# Patient Record
Sex: Female | Born: 1990 | Race: Black or African American | Hispanic: No | Marital: Single | Smoking: Current every day smoker
Health system: Southern US, Community
[De-identification: ages and names within clinical notes are randomized; demographics above are authoritative.]

## PROBLEM LIST (undated history)

## (undated) DIAGNOSIS — A599 Trichomoniasis, unspecified: Secondary | ICD-10-CM

## (undated) DIAGNOSIS — F329 Major depressive disorder, single episode, unspecified: Secondary | ICD-10-CM

## (undated) DIAGNOSIS — T1491XA Suicide attempt, initial encounter: Secondary | ICD-10-CM

## (undated) DIAGNOSIS — N12 Tubulo-interstitial nephritis, not specified as acute or chronic: Secondary | ICD-10-CM

## (undated) DIAGNOSIS — F32A Depression, unspecified: Secondary | ICD-10-CM

## (undated) DIAGNOSIS — A539 Syphilis, unspecified: Secondary | ICD-10-CM

## (undated) DIAGNOSIS — N390 Urinary tract infection, site not specified: Secondary | ICD-10-CM

## (undated) DIAGNOSIS — R87619 Unspecified abnormal cytological findings in specimens from cervix uteri: Secondary | ICD-10-CM

## (undated) DIAGNOSIS — IMO0002 Reserved for concepts with insufficient information to code with codable children: Secondary | ICD-10-CM

## (undated) DIAGNOSIS — A549 Gonococcal infection, unspecified: Secondary | ICD-10-CM

## (undated) DIAGNOSIS — D649 Anemia, unspecified: Secondary | ICD-10-CM

---
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
## 2008-07-28 ENCOUNTER — Emergency Department (HOSPITAL_COMMUNITY): Admission: EM | Admit: 2008-07-28 | Discharge: 2008-07-28 | Payer: Self-pay | Admitting: Emergency Medicine

## 2009-05-08 ENCOUNTER — Inpatient Hospital Stay (HOSPITAL_COMMUNITY): Admission: AD | Admit: 2009-05-08 | Discharge: 2009-05-08 | Payer: Self-pay | Admitting: Obstetrics & Gynecology

## 2009-06-01 ENCOUNTER — Ambulatory Visit: Payer: Self-pay | Admitting: Family Medicine

## 2009-06-29 ENCOUNTER — Ambulatory Visit: Payer: Self-pay | Admitting: Obstetrics & Gynecology

## 2009-07-14 ENCOUNTER — Inpatient Hospital Stay (HOSPITAL_COMMUNITY): Admission: AD | Admit: 2009-07-14 | Discharge: 2009-07-14 | Payer: Self-pay | Admitting: Family Medicine

## 2009-07-14 ENCOUNTER — Ambulatory Visit: Payer: Self-pay | Admitting: Obstetrics and Gynecology

## 2009-07-27 ENCOUNTER — Ambulatory Visit: Payer: Self-pay | Admitting: Family Medicine

## 2009-07-28 ENCOUNTER — Ambulatory Visit (HOSPITAL_COMMUNITY): Admission: RE | Admit: 2009-07-28 | Discharge: 2009-07-28 | Payer: Self-pay | Admitting: Family Medicine

## 2009-08-10 ENCOUNTER — Ambulatory Visit: Payer: Self-pay | Admitting: Family Medicine

## 2009-08-24 ENCOUNTER — Ambulatory Visit: Payer: Self-pay | Admitting: Obstetrics & Gynecology

## 2009-09-14 ENCOUNTER — Encounter (INDEPENDENT_AMBULATORY_CARE_PROVIDER_SITE_OTHER): Payer: Self-pay | Admitting: *Deleted

## 2009-09-14 ENCOUNTER — Ambulatory Visit: Payer: Self-pay | Admitting: Family Medicine

## 2009-09-28 ENCOUNTER — Ambulatory Visit: Payer: Self-pay | Admitting: Obstetrics and Gynecology

## 2009-09-28 ENCOUNTER — Ambulatory Visit (HOSPITAL_COMMUNITY): Admission: RE | Admit: 2009-09-28 | Discharge: 2009-09-28 | Payer: Self-pay | Admitting: Family Medicine

## 2009-09-28 ENCOUNTER — Encounter: Payer: Self-pay | Admitting: Family Medicine

## 2009-09-28 LAB — CONVERTED CEMR LAB
HCT: 34.8 % — ABNORMAL LOW (ref 36.0–46.0)
Platelets: 234 10*3/uL (ref 150–400)
RBC: 3.9 M/uL (ref 3.87–5.11)

## 2009-10-12 ENCOUNTER — Ambulatory Visit: Payer: Self-pay | Admitting: Obstetrics & Gynecology

## 2009-10-26 ENCOUNTER — Encounter: Payer: Self-pay | Admitting: Family Medicine

## 2009-10-26 ENCOUNTER — Ambulatory Visit (HOSPITAL_COMMUNITY): Admission: RE | Admit: 2009-10-26 | Discharge: 2009-10-26 | Payer: Self-pay | Admitting: Obstetrics & Gynecology

## 2009-10-26 ENCOUNTER — Ambulatory Visit: Payer: Self-pay | Admitting: Obstetrics and Gynecology

## 2009-10-27 ENCOUNTER — Ambulatory Visit: Payer: Self-pay | Admitting: Obstetrics & Gynecology

## 2009-10-28 ENCOUNTER — Encounter: Payer: Self-pay | Admitting: Obstetrics & Gynecology

## 2009-10-28 LAB — CONVERTED CEMR LAB
Trich, Wet Prep: NONE SEEN
Yeast Wet Prep HPF POC: NONE SEEN

## 2009-11-02 ENCOUNTER — Ambulatory Visit: Payer: Self-pay | Admitting: Obstetrics and Gynecology

## 2009-11-09 ENCOUNTER — Ambulatory Visit: Payer: Self-pay | Admitting: Obstetrics & Gynecology

## 2009-11-16 ENCOUNTER — Ambulatory Visit: Payer: Self-pay | Admitting: Family Medicine

## 2009-11-23 ENCOUNTER — Ambulatory Visit: Payer: Self-pay | Admitting: Obstetrics & Gynecology

## 2009-11-23 LAB — CONVERTED CEMR LAB: GC Probe Amp, Genital: NEGATIVE

## 2009-11-24 ENCOUNTER — Encounter: Payer: Self-pay | Admitting: Obstetrics & Gynecology

## 2009-11-26 ENCOUNTER — Inpatient Hospital Stay (HOSPITAL_COMMUNITY): Admission: AD | Admit: 2009-11-26 | Discharge: 2009-11-27 | Payer: Self-pay | Admitting: Obstetrics and Gynecology

## 2009-11-26 ENCOUNTER — Ambulatory Visit: Payer: Self-pay | Admitting: Obstetrics and Gynecology

## 2009-11-27 ENCOUNTER — Ambulatory Visit: Payer: Self-pay | Admitting: Obstetrics and Gynecology

## 2009-11-27 ENCOUNTER — Inpatient Hospital Stay (HOSPITAL_COMMUNITY): Admission: AD | Admit: 2009-11-27 | Discharge: 2009-11-27 | Payer: Self-pay | Admitting: Family Medicine

## 2009-12-07 ENCOUNTER — Ambulatory Visit: Payer: Self-pay | Admitting: Obstetrics & Gynecology

## 2009-12-12 ENCOUNTER — Ambulatory Visit: Payer: Self-pay | Admitting: Physician Assistant

## 2009-12-12 ENCOUNTER — Inpatient Hospital Stay (HOSPITAL_COMMUNITY): Admission: AD | Admit: 2009-12-12 | Discharge: 2009-12-15 | Payer: Self-pay | Admitting: Obstetrics & Gynecology

## 2010-01-15 ENCOUNTER — Ambulatory Visit: Payer: Self-pay | Admitting: Obstetrics and Gynecology

## 2010-03-04 NOTE — L&D Delivery Note (Signed)
Delivery Note At 10:32 PM a healthy female was delivered via Vaginal, Spontaneous Delivery (Presentation: ; Occiput Anterior).  APGAR: 8, 9; weight 6 lb 1 oz (2750 g).   Placenta status: Intact, Spontaneous.  Cord: 3 vessels with the following complications: None.   Anesthesia: Epidural  Episiotomy: None Lacerations: None Suture Repair: n/a Est. Blood Loss (mL): <500 mL  Mom to postpartum.  Baby to nursery-stable.  Case supervised by Maylon Cos, CNM  Normalee Sistare 02/27/2011, 10:49 PM

## 2010-03-12 ENCOUNTER — Ambulatory Visit: Admit: 2010-03-12 | Payer: Self-pay | Admitting: Obstetrics and Gynecology

## 2010-03-25 ENCOUNTER — Encounter: Payer: Self-pay | Admitting: *Deleted

## 2010-03-25 ENCOUNTER — Encounter: Payer: Self-pay | Admitting: Family Medicine

## 2010-04-02 ENCOUNTER — Ambulatory Visit
Admission: RE | Admit: 2010-04-02 | Payer: Self-pay | Source: Home / Self Care | Attending: Obstetrics and Gynecology | Admitting: Obstetrics and Gynecology

## 2010-04-04 ENCOUNTER — Other Ambulatory Visit: Payer: Self-pay | Admitting: Family Medicine

## 2010-04-04 ENCOUNTER — Ambulatory Visit: Payer: Medicaid Other

## 2010-04-04 ENCOUNTER — Ambulatory Visit: Payer: Medicaid Other | Admitting: Family Medicine

## 2010-04-04 ENCOUNTER — Other Ambulatory Visit (HOSPITAL_COMMUNITY)
Admission: RE | Admit: 2010-04-04 | Discharge: 2010-04-04 | Disposition: A | Discharge status: Home / Self Care | Payer: Medicaid Other | Source: Ambulatory Visit | Attending: Obstetrics and Gynecology | Admitting: Obstetrics and Gynecology

## 2010-04-04 ENCOUNTER — Other Ambulatory Visit: Payer: Self-pay | Admitting: Obstetrics and Gynecology

## 2010-04-04 DIAGNOSIS — Z124 Encounter for screening for malignant neoplasm of cervix: Secondary | ICD-10-CM

## 2010-04-04 DIAGNOSIS — Z01419 Encounter for gynecological examination (general) (routine) without abnormal findings: Secondary | ICD-10-CM

## 2010-04-04 DIAGNOSIS — Z3049 Encounter for surveillance of other contraceptives: Secondary | ICD-10-CM

## 2010-04-13 ENCOUNTER — Emergency Department (HOSPITAL_COMMUNITY)
Admission: EM | Admit: 2010-04-13 | Discharge: 2010-04-13 | Disposition: A | Discharge status: Home / Self Care | Payer: Medicaid Other | Attending: Emergency Medicine | Admitting: Emergency Medicine

## 2010-04-13 DIAGNOSIS — N760 Acute vaginitis: Secondary | ICD-10-CM | POA: Insufficient documentation

## 2010-04-13 DIAGNOSIS — R112 Nausea with vomiting, unspecified: Secondary | ICD-10-CM | POA: Insufficient documentation

## 2010-04-13 DIAGNOSIS — R197 Diarrhea, unspecified: Secondary | ICD-10-CM | POA: Insufficient documentation

## 2010-04-13 DIAGNOSIS — R109 Unspecified abdominal pain: Secondary | ICD-10-CM | POA: Insufficient documentation

## 2010-04-13 DIAGNOSIS — B9689 Other specified bacterial agents as the cause of diseases classified elsewhere: Secondary | ICD-10-CM | POA: Insufficient documentation

## 2010-04-13 DIAGNOSIS — A499 Bacterial infection, unspecified: Secondary | ICD-10-CM | POA: Insufficient documentation

## 2010-04-13 LAB — CBC
HCT: 42.8 % (ref 36.0–46.0)
MCH: 27.8 pg (ref 26.0–34.0)
MCHC: 32.2 g/dL (ref 30.0–36.0)
MCV: 86.1 fL (ref 78.0–100.0)
Platelets: 256 10*3/uL (ref 150–400)
RDW: 12.5 % (ref 11.5–15.5)
WBC: 6.9 10*3/uL (ref 4.0–10.5)

## 2010-04-13 LAB — POCT I-STAT, CHEM 8
BUN: 6 mg/dL (ref 6–23)
Calcium, Ion: 1.11 mmol/L — ABNORMAL LOW (ref 1.12–1.32)
Chloride: 107 mEq/L (ref 96–112)
Creatinine, Ser: 0.9 mg/dL (ref 0.4–1.2)
Glucose, Bld: 98 mg/dL (ref 70–99)
HCT: 45 % (ref 36.0–46.0)
Hemoglobin: 15.3 g/dL — ABNORMAL HIGH (ref 12.0–15.0)
Potassium: 4.2 mEq/L (ref 3.5–5.1)
Sodium: 139 mEq/L (ref 135–145)
TCO2: 20 mmol/L (ref 0–100)

## 2010-04-13 LAB — URINE MICROSCOPIC-ADD ON

## 2010-04-13 LAB — DIFFERENTIAL
Eosinophils Absolute: 0.1 10*3/uL (ref 0.0–0.7)
Eosinophils Relative: 1 % (ref 0–5)
Lymphocytes Relative: 13 % (ref 12–46)
Lymphs Abs: 0.9 10*3/uL (ref 0.7–4.0)
Monocytes Absolute: 0.4 10*3/uL (ref 0.1–1.0)
Monocytes Relative: 6 % (ref 3–12)

## 2010-04-13 LAB — WET PREP, GENITAL: Trich, Wet Prep: NONE SEEN

## 2010-04-13 LAB — URINALYSIS, ROUTINE W REFLEX MICROSCOPIC
Hgb urine dipstick: NEGATIVE
Protein, ur: NEGATIVE mg/dL
Specific Gravity, Urine: 1.027 (ref 1.005–1.030)
Urine Glucose, Fasting: NEGATIVE mg/dL

## 2010-04-15 LAB — GC/CHLAMYDIA PROBE AMP, GENITAL
Chlamydia, DNA Probe: NEGATIVE
GC Probe Amp, Genital: NEGATIVE

## 2010-04-18 ENCOUNTER — Ambulatory Visit (INDEPENDENT_AMBULATORY_CARE_PROVIDER_SITE_OTHER): Payer: Medicaid Other | Admitting: Family Medicine

## 2010-04-18 ENCOUNTER — Other Ambulatory Visit: Payer: Self-pay | Admitting: Family Medicine

## 2010-04-18 DIAGNOSIS — Z3202 Encounter for pregnancy test, result negative: Secondary | ICD-10-CM

## 2010-04-18 DIAGNOSIS — Z3009 Encounter for other general counseling and advice on contraception: Secondary | ICD-10-CM

## 2010-04-18 LAB — POCT PREGNANCY, URINE: Preg Test, Ur: NEGATIVE

## 2010-05-17 LAB — URINALYSIS, ROUTINE W REFLEX MICROSCOPIC
Bilirubin Urine: NEGATIVE
Ketones, ur: NEGATIVE mg/dL
Nitrite: NEGATIVE
Protein, ur: NEGATIVE mg/dL
Urobilinogen, UA: 0.2 mg/dL (ref 0.0–1.0)

## 2010-05-17 LAB — POCT URINALYSIS DIPSTICK
Bilirubin Urine: NEGATIVE
Bilirubin Urine: NEGATIVE
Bilirubin Urine: NEGATIVE
Glucose, UA: NEGATIVE mg/dL
Glucose, UA: NEGATIVE mg/dL
Glucose, UA: NEGATIVE mg/dL
Hgb urine dipstick: NEGATIVE
Ketones, ur: NEGATIVE mg/dL
Ketones, ur: NEGATIVE mg/dL
Nitrite: NEGATIVE
Nitrite: NEGATIVE
Nitrite: NEGATIVE
Protein, ur: NEGATIVE mg/dL
Protein, ur: NEGATIVE mg/dL
Specific Gravity, Urine: 1.015 (ref 1.005–1.030)
Urobilinogen, UA: 0.2 mg/dL (ref 0.0–1.0)
pH: 6 (ref 5.0–8.0)

## 2010-05-17 LAB — CBC
HCT: 35.1 % — ABNORMAL LOW (ref 36.0–46.0)
Hemoglobin: 11.8 g/dL — ABNORMAL LOW (ref 12.0–15.0)
MCH: 29.8 pg (ref 26.0–34.0)
MCV: 89.1 fL (ref 78.0–100.0)
MCV: 89.7 fL (ref 78.0–100.0)
Platelets: 193 10*3/uL (ref 150–400)
RBC: 3.94 MIL/uL (ref 3.87–5.11)
RDW: 12.4 % (ref 11.5–15.5)
WBC: 11.1 10*3/uL — ABNORMAL HIGH (ref 4.0–10.5)

## 2010-05-17 LAB — URINE CULTURE
Colony Count: NO GROWTH
Culture  Setup Time: 201110120115
Culture: NO GROWTH

## 2010-05-17 LAB — RPR: RPR Ser Ql: NONREACTIVE

## 2010-05-18 LAB — POCT URINALYSIS DIPSTICK
Bilirubin Urine: NEGATIVE
Glucose, UA: NEGATIVE mg/dL
Glucose, UA: NEGATIVE mg/dL
Hgb urine dipstick: NEGATIVE
Ketones, ur: NEGATIVE mg/dL
Nitrite: NEGATIVE
Specific Gravity, Urine: 1.02 (ref 1.005–1.030)
Specific Gravity, Urine: 1.02 (ref 1.005–1.030)

## 2010-05-19 LAB — POCT URINALYSIS DIP (DEVICE)
Bilirubin Urine: NEGATIVE
Hgb urine dipstick: NEGATIVE
Ketones, ur: NEGATIVE mg/dL
pH: 6.5 (ref 5.0–8.0)

## 2010-05-20 LAB — POCT URINALYSIS DIP (DEVICE)
Glucose, UA: NEGATIVE mg/dL
Hgb urine dipstick: NEGATIVE
Ketones, ur: NEGATIVE mg/dL
Ketones, ur: NEGATIVE mg/dL
Protein, ur: NEGATIVE mg/dL
Specific Gravity, Urine: 1.02 (ref 1.005–1.030)
Specific Gravity, Urine: 1.02 (ref 1.005–1.030)
Urobilinogen, UA: 0.2 mg/dL (ref 0.0–1.0)

## 2010-05-21 LAB — POCT URINALYSIS DIP (DEVICE)
Bilirubin Urine: NEGATIVE
Bilirubin Urine: NEGATIVE
Glucose, UA: NEGATIVE mg/dL
Hgb urine dipstick: NEGATIVE
Ketones, ur: NEGATIVE mg/dL
Nitrite: NEGATIVE
Protein, ur: NEGATIVE mg/dL
Specific Gravity, Urine: 1.02 (ref 1.005–1.030)
Urobilinogen, UA: 0.2 mg/dL (ref 0.0–1.0)
Urobilinogen, UA: 0.2 mg/dL (ref 0.0–1.0)
pH: 7.5 (ref 5.0–8.0)

## 2010-05-22 LAB — URINALYSIS, ROUTINE W REFLEX MICROSCOPIC
Nitrite: NEGATIVE
Protein, ur: NEGATIVE mg/dL
Urobilinogen, UA: 0.2 mg/dL (ref 0.0–1.0)

## 2010-05-22 LAB — POCT URINALYSIS DIP (DEVICE)
Glucose, UA: NEGATIVE mg/dL
Hgb urine dipstick: NEGATIVE
Nitrite: NEGATIVE
Urobilinogen, UA: 1 mg/dL (ref 0.0–1.0)

## 2010-05-25 NOTE — Progress Notes (Signed)
Pamela Bridges, Pamela Bridges NO.:  [id number]  MEDICAL RECORD NO.:  [id number]           PATIENT TYPE:  A  LOCATION:  [facility]                   FACILITY:  [facility]  PHYSICIAN:  Lucina Mellow, DO   DATE OF BIRTH:  03-31-1990  DATE OF SERVICE:  04/18/2010                                 CLINIC NOTE  The patient's chief complaint for visit was that she needed to receive her Depo shot, however.  HISTORY OF PRESENT ILLNESS:  She had unprotected sex last night and she wanted to discuss other options for her birth control.  HISTORY OF PRESENT ILLNESS:  Includes that she presented 2 weeks ago for scheduled Depo shot, but had had unprotected sex in the interim and she was late last time for her Depo shot, was told to abstain from sex or use a barrier method for the last 2 weeks and then she comes to the clinic today and gets the shot, however, because she had unprotected sex last night.  She is having to wait for her Depo shot.  She did get a urine pregnancy tested in clinic which was negative and she opts to have oral birth control pills.  She has no other complaints.  She is 4 months postpartum.  She is a gravida 2, para 2.  PHYSICAL EXAMINATION:  VITAL SIGNS:  Her temperature is 98.1, pulse is 82, blood pressure 104/63, weight of 160.8, height is 65 inches. GENERAL:  The patient is a pleasant African American female who looks her stated age of 20 years old. HEART:  Regular rate and rhythm with no murmurs. LUNGS:  Clear to auscultation bilaterally.  ASSESSMENT:  Undesired fertility.  PLAN:  She has a negative urine pregnancy test today in clinic and desires to oral contraceptive pills.  We will write a prescription today for Sprintec.  She is recommended to start the Sprintec today starting mid week of the first week and then go forward with the pill back at that time.  She also states that she had a period.  Her last day of bleeding was April 04, 2010, and since  that was approximately 2 weeks ago, she may be about to start a period again and she can also wait and see when her period starts to start the birth control at that time, however in the interim, she does need to use a barrier method whether it is a condom or spermicidal inserter gel.  She voices understanding of these plans and agrees to try the Sprintec if she has any problems, placed a home pregnancy test, if it turns out positive or when this is due, she is to call the clinic.  She voices understanding and agrees with these plans.          ______________________________ Lucina Mellow, DO    SH/MEDQ  D:  04/18/2010  T:  04/19/2010  Job:  [id number]

## 2010-05-27 LAB — URINE MICROSCOPIC-ADD ON

## 2010-05-27 LAB — WET PREP, GENITAL: Yeast Wet Prep HPF POC: NONE SEEN

## 2010-05-27 LAB — GC/CHLAMYDIA PROBE AMP, GENITAL: Chlamydia, DNA Probe: NEGATIVE

## 2010-05-27 LAB — URINALYSIS, ROUTINE W REFLEX MICROSCOPIC
Hgb urine dipstick: NEGATIVE
Protein, ur: NEGATIVE mg/dL
Urobilinogen, UA: 2 mg/dL — ABNORMAL HIGH (ref 0.0–1.0)

## 2010-05-27 LAB — POCT URINALYSIS DIP (DEVICE)
Bilirubin Urine: NEGATIVE
Ketones, ur: NEGATIVE mg/dL

## 2010-05-27 LAB — URINE CULTURE: Colony Count: NO GROWTH

## 2010-05-27 LAB — POCT PREGNANCY, URINE: Preg Test, Ur: POSITIVE

## 2010-06-12 LAB — URINALYSIS, ROUTINE W REFLEX MICROSCOPIC
Nitrite: NEGATIVE
Specific Gravity, Urine: 1.027 (ref 1.005–1.030)
pH: 6 (ref 5.0–8.0)

## 2010-06-12 LAB — URINE CULTURE: Colony Count: 8000

## 2010-06-12 LAB — POCT PREGNANCY, URINE: Preg Test, Ur: NEGATIVE

## 2010-06-12 LAB — URINE MICROSCOPIC-ADD ON

## 2010-07-20 NOTE — Discharge Summary (Signed)
Pamela Bridges, Pamela Bridges        ACCOUNT NO.:  [id number]   MEDICAL RECORD NO.:  [id number]          PATIENT TYPE:  INP   LOCATION:  9303                          [facility]ILITY[facility]   PHYSICIAN:  Tracy L. Mayford Knife, M.D.DATE OF BIRTH:  November 08, 1990   DATE OF ADMISSION:  04/24/2006  DATE OF DISCHARGE:  04/25/2006                               DISCHARGE SUMMARY   REASON FOR ADMISSION:  1. A 17-week pregnancy with left CVA tenderness, rule out      pyelonephritis.  2. 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  3. Polysubstance abuse.   DISCHARGE DIAGNOSES:  1. A 17-week 2-day intrauterine pregnancy and delivered with left CVA      tenderness consistent with musculoskeletal pain.  No signs of      pyelonephritis.  2. Positive RPR.  3. Polysubstance abuse.   PERTINENT LABORATORY DATA:  UA with specific gravity of greater than  1.03, negative leukocyte esterase for nitrates and protein.  CBC with  hemoglobin 11, wbc 6.8.   HOSPITAL COURSE:  Patient is a 20 year old G1 at 17 weeks 1 day, here as  a direct admit from [facility] She presented for her routine  prenatal visit and complained of left CVA tenderness.  Reported it had  started that day, rated it 7/10, denied dysuria, fever, chills or night  sweats.  Urine at [facility] showed numerous wbc.  Patient was going  to be treated as an outpatient but when Dr. Penne Lash tried to counsel her  on signs and symptoms that she should be reevaluated in the MAU, patient  was unable to comprehend so decision was made to admit for observation.   Patient's hospital course was unremarkable.  She was started on Rocephin  and IV fluids which she tolerated well.  Continued to have some CVA  tenderness, though was encouraged to use a heating pad.   Patient was also given her flu vaccine during this hospitalization and  at the time of this dictation she has yet to get it.  She does endorse  an allergy to eggs  that she described as causing her to itch and vomit;  however, she denies anaphylaxis.  Patient also reports that she has had  the flu vaccine previously when she was incarcerated and had no  difficulties with it.  Patient was counseled on the risks and benefits  and wanted to get the vaccine.   Patient was found to have a positive RPR with titer 1-2 during her  prenatal visit.  She was given Bicillin in July 2007 but never returned  for her second and third treatment so she was restarted on Bicillin on  April 24, 2006, and the rest of the doses will be given at the health  department.   DISPOSITION:  Home.   FOLLOW UP:  Women's Health in one week and Bicillin per health  department.  Routine preterm labor and counseled on signs and symptoms  ob pyelonephritis.   DIET:  Regular.           ______________________________  Marc Morgans. Mayford Knife, M.D.  TLW/MEDQ  D:  04/25/2006  T:  04/25/2006  Job:  [id number]

## 2010-08-16 ENCOUNTER — Inpatient Hospital Stay (HOSPITAL_COMMUNITY): Payer: Medicaid Other

## 2010-08-16 ENCOUNTER — Inpatient Hospital Stay (HOSPITAL_COMMUNITY)
Admission: AD | Admit: 2010-08-16 | Discharge: 2010-08-16 | Disposition: A | Discharge status: Home / Self Care | Payer: Medicaid Other | Source: Ambulatory Visit | Attending: Family Medicine | Admitting: Family Medicine

## 2010-08-16 DIAGNOSIS — O9989 Other specified diseases and conditions complicating pregnancy, childbirth and the puerperium: Secondary | ICD-10-CM

## 2010-08-16 DIAGNOSIS — R109 Unspecified abdominal pain: Secondary | ICD-10-CM | POA: Insufficient documentation

## 2010-08-16 DIAGNOSIS — O99891 Other specified diseases and conditions complicating pregnancy: Secondary | ICD-10-CM | POA: Insufficient documentation

## 2010-08-16 LAB — WET PREP, GENITAL
Clue Cells Wet Prep HPF POC: NONE SEEN
Trich, Wet Prep: NONE SEEN

## 2010-08-16 LAB — URINALYSIS, ROUTINE W REFLEX MICROSCOPIC
Glucose, UA: NEGATIVE mg/dL
Hgb urine dipstick: NEGATIVE
Protein, ur: NEGATIVE mg/dL

## 2010-08-16 LAB — HCG, QUANTITATIVE, PREGNANCY: hCG, Beta Chain, Quant, S: 54641 m[IU]/mL — ABNORMAL HIGH (ref <5)

## 2010-08-16 LAB — CBC
HCT: 38.1 % (ref 36.0–46.0)
Hemoglobin: 12.6 g/dL (ref 12.0–15.0)
WBC: 6.5 10*3/uL (ref 4.0–10.5)

## 2010-08-16 LAB — ABO/RH: ABO/RH(D): O POS

## 2010-08-16 LAB — POCT PREGNANCY, URINE: Preg Test, Ur: POSITIVE

## 2010-09-24 ENCOUNTER — Inpatient Hospital Stay (HOSPITAL_COMMUNITY)
Admission: AD | Admit: 2010-09-24 | Discharge: 2010-09-24 | Disposition: A | Discharge status: Hospice | Payer: Medicaid Other | Source: Ambulatory Visit | Attending: Obstetrics & Gynecology | Admitting: Obstetrics & Gynecology

## 2010-09-24 ENCOUNTER — Encounter (HOSPITAL_COMMUNITY): Payer: Self-pay | Admitting: *Deleted

## 2010-09-24 DIAGNOSIS — R109 Unspecified abdominal pain: Secondary | ICD-10-CM | POA: Insufficient documentation

## 2010-09-24 DIAGNOSIS — O2390 Unspecified genitourinary tract infection in pregnancy, unspecified trimester: Secondary | ICD-10-CM | POA: Insufficient documentation

## 2010-09-24 DIAGNOSIS — N390 Urinary tract infection, site not specified: Secondary | ICD-10-CM

## 2010-09-24 DIAGNOSIS — O2340 Unspecified infection of urinary tract in pregnancy, unspecified trimester: Secondary | ICD-10-CM

## 2010-09-24 LAB — URINALYSIS, ROUTINE W REFLEX MICROSCOPIC
Glucose, UA: NEGATIVE mg/dL
Ketones, ur: NEGATIVE mg/dL
Protein, ur: NEGATIVE mg/dL

## 2010-09-24 LAB — URINE MICROSCOPIC-ADD ON

## 2010-09-24 MED ORDER — PHENAZOPYRIDINE HCL 200 MG PO TABS: 200.0000 mg | ORAL_TABLET | Freq: Three times a day (TID) | ORAL | Status: AC | PRN

## 2010-09-24 MED ORDER — NITROFURANTOIN MONOHYD MACRO 100 MG PO CAPS: 100.0000 mg | ORAL_CAPSULE | Freq: Two times a day (BID) | ORAL | Status: AC

## 2010-09-24 NOTE — Progress Notes (Signed)
Dysuria since 1400 abd pain since 1600

## 2010-09-26 LAB — URINE CULTURE: Special Requests: NORMAL

## 2010-10-03 ENCOUNTER — Other Ambulatory Visit: Payer: Self-pay | Admitting: Family Medicine

## 2010-10-03 DIAGNOSIS — Z3689 Encounter for other specified antenatal screening: Secondary | ICD-10-CM

## 2010-10-03 DIAGNOSIS — R772 Abnormality of alphafetoprotein: Secondary | ICD-10-CM

## 2010-10-05 ENCOUNTER — Other Ambulatory Visit: Payer: Self-pay

## 2010-10-05 ENCOUNTER — Other Ambulatory Visit: Payer: Self-pay | Admitting: Family Medicine

## 2010-10-08 NOTE — ED Provider Notes (Signed)
History   The pt is a 20 year-old G3P1102  At 15.3 weeks who presents to MAU reporting dysuria and frequency since 1400 and mild, diffuse low abd pain since 1600. She denies VB or LOF. She has not initiated [facility] this pregnancy.  Chief Complaint  Patient presents with  . Cystitis  . Abdominal Pain   HPI  OB History    Grav Para Term Preterm Abortions TAB SAB Ect Mult Living   3 2 1 1      2       Past Medical History  Diagnosis Date  . No pertinent past medical history     Past Surgical History  Procedure Date  . Wisdom tooth extraction     No family history on file.  History  Substance Use Topics  . Smoking status: Never Smoker   . Smokeless tobacco: Never Used  . Alcohol Use: No    Allergies: No Known Allergies  No prescriptions prior to admission    ROS neg Physical Exam   Blood pressure 126/79, pulse 79, temperature 98.7 F (37.1 C), temperature source Oral, resp. rate 20.  Physical Exam  Constitutional: She is oriented to person, place, and time. She appears well-developed and well-nourished. No distress.  Cardiovascular: Normal rate.   Respiratory: Effort normal.  GI: Soft. Bowel sounds are normal. There is no tenderness.  Genitourinary: Vagina normal and uterus normal. No vaginal discharge found.  Neurological: She is alert and oriented to person, place, and time.  Skin: Skin is warm and dry.  +FHTs Dilation: Closed Effacement (%): Thick Cervical Position: Posterior Station: Ballotable Exam by:: V.Kennedie Pardoe,CNM UA: SG 1.025, tr hgb   MAU Course  Procedures   Assessment and Plan  Assessment: 1. Possible UTI  Plan: 1. Rx Macrobid PO BID x 7 days 2. Urine culture 3. Initiate PNC. List or providers given   Dorathy Kinsman 10/08/2010, 8:59 PM

## 2010-10-10 ENCOUNTER — Ambulatory Visit (HOSPITAL_COMMUNITY): Payer: Self-pay

## 2010-10-16 ENCOUNTER — Ambulatory Visit (HOSPITAL_COMMUNITY)
Admission: RE | Admit: 2010-10-16 | Discharge: 2010-10-16 | Disposition: A | Discharge status: Home / Self Care | Payer: Self-pay | Source: Ambulatory Visit | Attending: Family Medicine | Admitting: Family Medicine

## 2010-10-16 DIAGNOSIS — R772 Abnormality of alphafetoprotein: Secondary | ICD-10-CM

## 2010-10-16 DIAGNOSIS — O358XX0 Maternal care for other (suspected) fetal abnormality and damage, not applicable or unspecified: Secondary | ICD-10-CM | POA: Insufficient documentation

## 2010-10-16 DIAGNOSIS — Z3689 Encounter for other specified antenatal screening: Secondary | ICD-10-CM

## 2010-10-16 DIAGNOSIS — Z1389 Encounter for screening for other disorder: Secondary | ICD-10-CM | POA: Insufficient documentation

## 2010-10-16 DIAGNOSIS — Z363 Encounter for antenatal screening for malformations: Secondary | ICD-10-CM | POA: Insufficient documentation

## 2010-10-24 ENCOUNTER — Encounter (HOSPITAL_COMMUNITY): Payer: Self-pay

## 2010-10-24 ENCOUNTER — Ambulatory Visit (HOSPITAL_COMMUNITY)
Admission: RE | Admit: 2010-10-24 | Discharge: 2010-10-24 | Disposition: A | Discharge status: Home / Self Care | Payer: Medicaid Other | Source: Ambulatory Visit | Attending: Family Medicine | Admitting: Family Medicine

## 2010-10-24 DIAGNOSIS — O280 Abnormal hematological finding on antenatal screening of mother: Secondary | ICD-10-CM | POA: Insufficient documentation

## 2010-10-24 NOTE — Progress Notes (Signed)
Genetic Counseling  High-Risk Gestation Note  Appointment Date:  10/24/2010 Referred By: Reva Bores, MD Date of Birth:  30-Jun-1990 Partner:  [name]    Pregnancy History: [id number] Estimated Date of Delivery: 03/15/11 Estimated Gestational Age: [redacted]w[redacted]d  I met with [name] and her partner, Mr. [name], for genetic counseling because of a borderline elevated MSAFP of 2.06 MoMs based on maternal serum screening through [facility].    We reviewed [name]'s maternal serum screening result including her borderline elevation of MSAFP.  We discussed that borderline values (2.0-2.49 MoMs) are considered to be within the normal range, but at the high end of normal.  Literature shows that ~80% of ONTDs are detected by MSAFP screening using a cutoff value of 2.50 MoMs; in addition, literature also supports that of those 20% of ONTDs that are missed by using a cutoff of 2.50 MoMs, the majority have values that fall in the borderline range.  For this reason, the [facility] Maternal Serum Screening Program recommends that all patients who have an MSAFP value of 2.0-2.49 be offered the option of a detailed ultrasound.    We then reviewed ONTDs, the typical multifactorial etiology, and variable prognosis.  In addition, we discussed additional explanations for a borderline elevated MSAFP including: normal variation, twins, feto-maternal bleeding, a gestational dating error, abdominal wall defects, kidney differences, oligohydramnios, and placental problems.    [name] had a detailed ultrasound at [facility] on October 16, 2010.  We reviewed her results.  Although the anatomy was somewhat difficult to visualize due to fetal position, no anomalies or markers for ONTDs were visualized.   They were then counseled that 80-90% of fetuses with ONTDs can be detected by detailed 2nd trimester ultrasound.    [name] was provided with written information regarding sickle cell anemia  (SCA) including the carrier frequency and incidence in the African American population, the availability of carrier testing and prenatal diagnosis if indicated.  Her hemoglobin electrophoresis results were within normal limits.   Both family histories were reviewed and found to be contributory for the FOB having SCA.  We discussed that in light of [name]'s normal hemoglobin electrophoresis results, the fetus is not expected to have an increased risk for a hemoglobinopathy.  Given that Mr. [name] has SCA, the fetus will be a carrier of SCA (sickle cell trait). There is no other history of birth defects, mental retardation, and known genetic conditions.  Without further information regarding the provided family history, an accurate genetic risk cannot be calculated. Further genetic counseling is warranted if more information is obtained.  [name] denied exposure to environmental toxins or chemical agents. She denied the use of alcohol and street drugs. She denied significant viral illnesses during the course of her pregnancy. She reported that she smokes 1/2 ppd.  We discussed the implications and counseled regarding cessation.  Her medical and surgical histories were contributory PIH.     I counseled this couple for approximately 36 minutes regarding the above risks and available options.   [name], Berthe Oley 10/24/2010

## 2010-12-13 LAB — CBC
HCT: 41
MCV: 83.3
Platelets: 321
RDW: 12.4
WBC: 6.4

## 2010-12-18 LAB — RAPID URINE DRUG SCREEN, HOSP PERFORMED
Amphetamines: NOT DETECTED
Amphetamines: NOT DETECTED
Barbiturates: NOT DETECTED
Barbiturates: NOT DETECTED
Benzodiazepines: NOT DETECTED
Benzodiazepines: NOT DETECTED
Cocaine: NOT DETECTED
Opiates: NOT DETECTED
Tetrahydrocannabinol: NOT DETECTED
Tetrahydrocannabinol: POSITIVE — AB

## 2010-12-18 LAB — CBC
Hemoglobin: 12.4
MCHC: 33
MCV: 86
RDW: 12.5

## 2010-12-18 LAB — RPR TITER: RPR Titer: 1:2 {titer} — AB

## 2010-12-19 LAB — URINALYSIS, ROUTINE W REFLEX MICROSCOPIC
Bilirubin Urine: NEGATIVE
Glucose, UA: NEGATIVE
Hgb urine dipstick: NEGATIVE
Ketones, ur: NEGATIVE
Protein, ur: NEGATIVE

## 2010-12-19 LAB — POCT URINALYSIS DIP (DEVICE)
Hgb urine dipstick: NEGATIVE
Nitrite: NEGATIVE
Operator id: 120861
Protein, ur: 30 — AB
Protein, ur: NEGATIVE
Specific Gravity, Urine: 1.02
Specific Gravity, Urine: 1.025
Urobilinogen, UA: 0.2
Urobilinogen, UA: 1

## 2010-12-19 LAB — URINE MICROSCOPIC-ADD ON

## 2010-12-20 LAB — POCT URINALYSIS DIP (DEVICE)
Bilirubin Urine: NEGATIVE
Glucose, UA: NEGATIVE
Ketones, ur: NEGATIVE
Operator id: 120861
Protein, ur: NEGATIVE

## 2010-12-28 LAB — TPPA: Treponema Confirm: REACTIVE — AB

## 2011-01-09 ENCOUNTER — Inpatient Hospital Stay (HOSPITAL_COMMUNITY): Payer: Medicaid Other

## 2011-01-09 ENCOUNTER — Inpatient Hospital Stay (HOSPITAL_COMMUNITY)
Admission: AD | Admit: 2011-01-09 | Discharge: 2011-01-09 | Disposition: A | Discharge status: Home / Self Care | Payer: Medicaid Other | Source: Ambulatory Visit | Attending: Obstetrics & Gynecology | Admitting: Obstetrics & Gynecology

## 2011-01-09 ENCOUNTER — Encounter (HOSPITAL_COMMUNITY): Payer: Self-pay | Admitting: *Deleted

## 2011-01-09 DIAGNOSIS — O479 False labor, unspecified: Secondary | ICD-10-CM

## 2011-01-09 DIAGNOSIS — R109 Unspecified abdominal pain: Secondary | ICD-10-CM | POA: Insufficient documentation

## 2011-01-09 DIAGNOSIS — O2340 Unspecified infection of urinary tract in pregnancy, unspecified trimester: Secondary | ICD-10-CM

## 2011-01-09 DIAGNOSIS — O4700 False labor before 37 completed weeks of gestation, unspecified trimester: Secondary | ICD-10-CM | POA: Insufficient documentation

## 2011-01-09 HISTORY — DX: Depression, unspecified: F32.A

## 2011-01-09 HISTORY — DX: Trichomoniasis, unspecified: A59.9

## 2011-01-09 HISTORY — DX: Reserved for concepts with insufficient information to code with codable children: IMO0002

## 2011-01-09 HISTORY — DX: Unspecified abnormal cytological findings in specimens from cervix uteri: R87.619

## 2011-01-09 HISTORY — DX: Major depressive disorder, single episode, unspecified: F32.9

## 2011-01-09 HISTORY — DX: Suicide attempt, initial encounter: T14.91XA

## 2011-01-09 HISTORY — DX: Syphilis, unspecified: A53.9

## 2011-01-09 HISTORY — DX: Tubulo-interstitial nephritis, not specified as acute or chronic: N12

## 2011-01-09 HISTORY — DX: Gonococcal infection, unspecified: A54.9

## 2011-01-09 HISTORY — DX: Anemia, unspecified: D64.9

## 2011-01-09 LAB — URINALYSIS, ROUTINE W REFLEX MICROSCOPIC
Bilirubin Urine: NEGATIVE
Nitrite: NEGATIVE
Specific Gravity, Urine: 1.025 (ref 1.005–1.030)
Urobilinogen, UA: 1 mg/dL (ref 0.0–1.0)

## 2011-01-09 LAB — CBC
MCH: 29 pg (ref 26.0–34.0)
MCHC: 32.2 g/dL (ref 30.0–36.0)
MCV: 90.1 fL (ref 78.0–100.0)
Platelets: 221 10*3/uL (ref 150–400)
RDW: 13.1 % (ref 11.5–15.5)
WBC: 9.4 10*3/uL (ref 4.0–10.5)

## 2011-01-09 LAB — URINE MICROSCOPIC-ADD ON

## 2011-01-09 LAB — WET PREP, GENITAL
Clue Cells Wet Prep HPF POC: NONE SEEN
Trich, Wet Prep: NONE SEEN

## 2011-01-09 MED ORDER — CEPHALEXIN 500 MG PO CAPS: 500.0000 mg | ORAL_CAPSULE | Freq: Two times a day (BID) | ORAL | Status: DC

## 2011-01-09 MED ORDER — NIFEDIPINE 10 MG PO CAPS: 10.0000 mg | ORAL_CAPSULE | Freq: Four times a day (QID) | ORAL | Status: DC | PRN

## 2011-01-09 MED ORDER — NIFEDIPINE 10 MG PO CAPS
10.0000 mg | ORAL_CAPSULE | ORAL | Status: DC | PRN
  Administered 2011-01-09 (×2): 10 mg via ORAL
  Filled 2011-01-09 (×2): qty 1

## 2011-01-09 MED ORDER — HYDROXYZINE HCL 50 MG/ML IM SOLN
50.0000 mg | Freq: Once | INTRAMUSCULAR | Status: AC
  Administered 2011-01-09: 50 mg via INTRAMUSCULAR
  Filled 2011-01-09: qty 1

## 2011-01-09 MED ORDER — LACTATED RINGERS IV BOLUS (SEPSIS)
1000.0000 mL | Freq: Once | INTRAVENOUS | Status: AC
  Administered 2011-01-09 (×2): 1000 mL via INTRAVENOUS

## 2011-01-09 MED ORDER — PHENAZOPYRIDINE HCL 200 MG PO TABS: 200.0000 mg | ORAL_TABLET | Freq: Three times a day (TID) | ORAL | Status: AC | PRN

## 2011-01-09 MED ORDER — NITROFURANTOIN MONOHYD MACRO 100 MG PO CAPS
100.0000 mg | ORAL_CAPSULE | Freq: Once | ORAL | Status: AC
  Administered 2011-01-09: 100 mg via ORAL
  Filled 2011-01-09: qty 1

## 2011-01-09 MED ORDER — PHENAZOPYRIDINE HCL 100 MG PO TABS
200.0000 mg | ORAL_TABLET | Freq: Once | ORAL | Status: AC
  Administered 2011-01-09: 200 mg via ORAL
  Filled 2011-01-09: qty 2

## 2011-01-09 MED ORDER — BETAMETHASONE SOD PHOS & ACET 6 (3-3) MG/ML IJ SUSP
12.0000 mg | Freq: Once | INTRAMUSCULAR | Status: AC
  Administered 2011-01-09: 12 mg via INTRAMUSCULAR
  Filled 2011-01-09: qty 2

## 2011-01-09 NOTE — ED Provider Notes (Signed)
History   Pamela Bridges is a 20 y.o. year old G48P1102 female at [redacted]w[redacted]d weeks gestation who presents to MAU reporting low abd pain, suprapubic tenderness and ? leaking small amount of fluid last night and this morning. She reports pos FM and denies VB or intercourse in past week .   Chief Complaint  Patient presents with  . Abdominal Pain   HPI  OB History    Grav Para Term Preterm Abortions TAB SAB Ect Mult Living   3 2 1 1      2       Past Medical History  Diagnosis Date  . Abnormal Pap smear   . Gonorrhea   . Trichomonas   . Syphilis   . Pyelonephritis   . Anemia   . Depression   . Suicide attempt     Past Surgical History  Procedure Date  . Wisdom tooth extraction     History reviewed. No pertinent family history.  History  Substance Use Topics  . Smoking status: Current Everyday Smoker -- 0.5 packs/day    Types: Cigarettes  . Smokeless tobacco: Never Used  . Alcohol Use: No    Allergies: No Known Allergies  Prescriptions prior to admission  Medication Sig Dispense Refill  . acetaminophen (TYLENOL) 500 MG tablet Take 500 mg by mouth every 6 (six) hours as needed. Patient takes for pain      . prenatal vitamin w/FE, FA (PRENATAL 1 + 1) 27-1 MG TABS Take 1 tablet by mouth daily.          ROS Physical Exam   Blood pressure 121/58, pulse 73, temperature 98.8 F (37.1 C), temperature source Oral, resp. rate 16, height 5\' 6"  (1.676 m), weight 72.122 kg (159 lb), SpO2 99.00%.  Physical Exam General: Mild-mod discomfort initially, significantly decreased after procardia and IV fluids Abd: Mild-mod UC's palpated. Soft in between.. Gravid S=D. No CVAT Pelvic: BUS WNL, small amount of thic, white, odorless discharge, no VB. Cervix non-friable. Dilation: 1 Effacement (%): 50 Cervical Position: Posterior Station: -2 Presentation: Vertex Exam by:: Pamela Bridges, CNM  EFM: Baseline: 140-150 bpm, moderate variability, pos accels, neg decels Toco: Irregular,  every 5-10 minutes, mild  Results for orders placed during the hospital encounter of 01/09/11 (from the past 48 hour(s))  URINALYSIS, ROUTINE W REFLEX MICROSCOPIC     Status: Abnormal   Collection Time   01/09/11 11:10 AM      Component Value Range Comment   Color, Urine YELLOW  YELLOW     Appearance CLEAR  CLEAR     Specific Gravity, Urine 1.025  1.005 - 1.030     pH 7.0  5.0 - 8.0     Glucose, UA NEGATIVE  NEGATIVE (mg/dL)    Hgb urine dipstick Bridges (*) NEGATIVE     Bilirubin Urine NEGATIVE  NEGATIVE     Ketones, ur NEGATIVE  NEGATIVE (mg/dL)    Protein, ur NEGATIVE  NEGATIVE (mg/dL)    Urobilinogen, UA 1.0  0.0 - 1.0 (mg/dL)    Nitrite NEGATIVE  NEGATIVE     Leukocytes, UA TRACE (*) NEGATIVE    URINE MICROSCOPIC-ADD ON     Status: Abnormal   Collection Time   01/09/11 11:10 AM      Component Value Range Comment   Squamous Epithelial / LPF FEW (*) RARE     WBC, UA 3-6  <3 (WBC/hpf)    RBC / HPF TOO NUMEROUS TO COUNT  <3 (RBC/hpf)    Casts HYALINE  CASTS (*) NEGATIVE                                                     WET PREP, GENITAL     Status: Abnormal   Collection Time   01/09/11 11:37 AM      Component Value Range Comment   Yeast, Wet Prep NONE SEEN  NONE SEEN     Trich, Wet Prep NONE SEEN  NONE SEEN     Clue Cells, Wet Prep NONE SEEN  NONE SEEN     WBC, Wet Prep HPF POC FEW (*) NONE SEEN  FEW BACTERIA SEEN  GC/CHLAMYDIA PROBE AMP, GENITAL     Status: Normal   Collection Time   01/09/11 11:37 AM      Component Value Range Comment   GC Probe Amp, Genital NEGATIVE  NEGATIVE     Chlamydia, DNA Probe NEGATIVE  NEGATIVE    FETAL FIBRONECTIN     Status: Abnormal   Collection Time   01/09/11 11:37 AM      Component Value Range Comment   Fetal Fibronectin POSITIVE (*) NEGATIVE    AMNISURE RUPTURE OF MEMBRANE (ROM)     Status: Normal   Collection Time   01/09/11 11:46 AM      Component Value Range Comment   Amnisure ROM NEGATIVE     CULTURE, BETA STREP (GROUP B  ONLY)     Status: Normal (Preliminary result)   Collection Time   01/09/11 11:56 AM      Component Value Range Comment   Specimen Description VAGINAL/RECTAL      Special Requests NONE      Culture Culture reincubated for better growth      Report Status PENDING     CBC     Status: Abnormal   Collection Time   01/09/11 12:03 PM      Component Value Range Comment   WBC 9.4  4.0 - 10.5 (K/uL)    RBC 3.72 (*) 3.87 - 5.11 (MIL/uL)    Hemoglobin 10.8 (*) 12.0 - 15.0 (g/dL)    HCT 29.5 (*) 62.1 - 46.0 (%)    MCV 90.1  78.0 - 100.0 (fL)    MCH 29.0  26.0 - 34.0 (pg)    MCHC 32.2  30.0 - 36.0 (g/dL)    RDW 30.8  65.7 - 84.6 (%)    Platelets 221  150 - 400 (K/uL)     MAU Course  Procedures   9629: No cervical change. Rare UC's. Pt smiling. BMZ #1 given. Assessment and Plan  Assessment: 1. UTI 2. Preterm UC's w/ minimal cervical change and +fFN  Plan; 1. D/C home per consult w/ Dr. Macon Bridges 2. F./U in 24 hours for second BMZ adn 1 week at [facility]. 3. Rx Keflex and Pyridium 4. Urine Culture    Pamela Bridges, Pamela Bridges 01/09/2011, 7:07 PM

## 2011-01-09 NOTE — Progress Notes (Signed)
Patient states she has been having severe abdominal pain for three days. Was unable to get up this morning without help. States she started to leak some clear fluid last night and again this am. Reports no bleeding and good fetal movement. States that she has been taking a pain medication(not sure of the name) for pain in her legs that was given to her by [employer]. Not helping.

## 2011-01-10 ENCOUNTER — Inpatient Hospital Stay (HOSPITAL_COMMUNITY)
Admission: AD | Admit: 2011-01-10 | Discharge: 2011-01-10 | Disposition: A | Discharge status: Home / Self Care | Payer: Medicaid Other | Source: Ambulatory Visit | Attending: Obstetrics & Gynecology | Admitting: Obstetrics & Gynecology

## 2011-01-10 DIAGNOSIS — O4700 False labor before 37 completed weeks of gestation, unspecified trimester: Secondary | ICD-10-CM | POA: Insufficient documentation

## 2011-01-10 LAB — GC/CHLAMYDIA PROBE AMP, GENITAL
Chlamydia, DNA Probe: NEGATIVE
GC Probe Amp, Genital: NEGATIVE

## 2011-01-10 MED ORDER — BETAMETHASONE SOD PHOS & ACET 6 (3-3) MG/ML IJ SUSP
12.0000 mg | Freq: Once | INTRAMUSCULAR | Status: AC
  Administered 2011-01-10: 12 mg via INTRAMUSCULAR
  Filled 2011-01-10: qty 2

## 2011-01-11 NOTE — ED Provider Notes (Signed)
Pamela Bridges is a 20 y.o. year old G64P1102 female at [redacted]w[redacted]d weeks gestation who presents to MAU for second dose BMZ after pos fFN yesterday.  Dilation: 1 Effacement (%): 50 Cervical Position: Middle Exam by:: Dorathy Kinsman CNM  A/P No change from exam yesterday D/C home w/ PTL precautions  Katrinka Blazing, Domani Bakos 01/10/2011 4:30 PM

## 2011-01-11 NOTE — ED Provider Notes (Signed)
Attestation of Attending Supervision of Advanced Practitioner: Evaluation and management procedures were performed by the PA/NP/CNM/OB Fellow under my supervision/collaboration. Chart reviewed, and agree with management and plan.  Alister Staver, M.D. 01/11/2011 2:49 PM   

## 2011-01-14 ENCOUNTER — Inpatient Hospital Stay (HOSPITAL_COMMUNITY)
Admission: AD | Admit: 2011-01-14 | Discharge: 2011-01-14 | Disposition: A | Discharge status: Home / Self Care | Payer: Medicaid Other | Source: Ambulatory Visit | Attending: Obstetrics & Gynecology | Admitting: Obstetrics & Gynecology

## 2011-01-14 ENCOUNTER — Inpatient Hospital Stay (HOSPITAL_COMMUNITY): Payer: Medicaid Other

## 2011-01-14 DIAGNOSIS — O479 False labor, unspecified: Secondary | ICD-10-CM

## 2011-01-14 DIAGNOSIS — R109 Unspecified abdominal pain: Secondary | ICD-10-CM | POA: Insufficient documentation

## 2011-01-14 DIAGNOSIS — O4700 False labor before 37 completed weeks of gestation, unspecified trimester: Secondary | ICD-10-CM | POA: Insufficient documentation

## 2011-01-14 LAB — CULTURE, BETA STREP (GROUP B ONLY)

## 2011-01-14 LAB — URINALYSIS, ROUTINE W REFLEX MICROSCOPIC
Bilirubin Urine: NEGATIVE
Hgb urine dipstick: NEGATIVE
Nitrite: NEGATIVE
Protein, ur: NEGATIVE mg/dL
Specific Gravity, Urine: 1.01 (ref 1.005–1.030)
Urobilinogen, UA: 1 mg/dL (ref 0.0–1.0)

## 2011-01-14 MED ORDER — NIFEDIPINE 10 MG PO CAPS
10.0000 mg | ORAL_CAPSULE | Freq: Once | ORAL | Status: AC
  Administered 2011-01-14: 10 mg via ORAL
  Filled 2011-01-14: qty 1

## 2011-01-14 MED ORDER — LACTATED RINGERS IV BOLUS (SEPSIS)
500.0000 mL | Freq: Once | INTRAVENOUS | Status: AC
  Administered 2011-01-14: 1000 mL via INTRAVENOUS

## 2011-01-14 NOTE — ED Provider Notes (Addendum)
History     Chief Complaint  Patient presents with  . Abdominal Pain   HPI 20 yo G3P1102 at 31.3 weeks seen 5 days ago in MAU for contractions and UTI presents again with contractions and lower pelvic pressure, pain that started about 6 hours ago this morning. Last week was treated with macrobid and had positive FFN, received BTZ x 2 doses. Since that time her contractions had stablized, but today has noticed increased severity and feeling much pelvic pressure also. Denies fever, chills, discharge, bleeding, LOF. Does still endorse pain with urination, but this is same as the constant pelvic pain.   Urine culture was negative from UTI specimen last week.   OB History    Grav Para Term Preterm Abortions TAB SAB Ect Mult Living   3 2 1 1      2       Past Medical History  Diagnosis Date  . Abnormal Pap smear   . Gonorrhea   . Trichomonas   . Syphilis   . Pyelonephritis   . Anemia   . Depression   . Suicide attempt     Past Surgical History  Procedure Date  . Wisdom tooth extraction     No family history on file.  History  Substance Use Topics  . Smoking status: Current Everyday Smoker -- 0.5 packs/day    Types: Cigarettes  . Smokeless tobacco: Never Used  . Alcohol Use: No    Allergies: No Known Allergies  Prescriptions prior to admission  Medication Sig Dispense Refill  . acetaminophen (TYLENOL) 500 MG tablet Take 500 mg by mouth every 6 (six) hours as needed. Patient takes for pain      . cephALEXin (KEFLEX) 500 MG capsule Take 1 capsule (500 mg total) by mouth 2 (two) times daily.  14 capsule  0  . NIFEdipine (PROCARDIA) 10 MG capsule Take 10 mg by mouth every 6 (six) hours as needed. contractions       . prenatal vitamin w/FE, FA (PRENATAL 1 + 1) 27-1 MG TABS Take 1 tablet by mouth daily.       Marland Kitchen DISCONTD: NIFEdipine (PROCARDIA) 10 MG capsule Take 1 capsule (10 mg total) by mouth every 6 (six) hours as needed (for >5 UC's/hr).  30 capsule  0    ROS Physical  Exam   Pulse 92, temperature 98.4 F (36.9 C), temperature source Oral, resp. rate 20, SpO2 99.00%.  Physical Exam  Vitals reviewed. Constitutional: She is oriented to person, place, and time. She appears well-developed and well-nourished.       Appears uncomfortable  HENT:  Head: Normocephalic and atraumatic.  Cardiovascular: Normal rate and normal heart sounds.   Respiratory: Effort normal.  GI: Soft.  Genitourinary: Vagina normal. No vaginal discharge found.       SVE: Fingertip/thick/high, posterior  Musculoskeletal: She exhibits no edema and no tenderness.  Neurological: She is alert and oriented to person, place, and time.  Skin: She is not diaphoretic.    MAU Course  Procedures UA: negative Urine ctx: negative  Toco: q 4-8 minutes mild. FHT: 145 baseline, no decels, moderate variability, no accels.  Assessment and Plan  20 yo G3P1102 at 31.3 with preterm contractions, s/p betamethasone.   -give IVF bolus and procardia for contractions and patient discomfort. -US shows improved cervix 3.3cm, 51% EFW, BPP 8/8 - DC on oral procardia.  -will f/u in High Risk clinic for PTL  [facility] 01/14/2011, 11:54 AM   -Contractions are now rare, FHTs  remain reassuring with moderate variabilty, baseline 130-140s. Patient to go home and fill rx for procardia prn for contractions. Will f/u in [facility] for preterm contractions with procardia and hx of PTL, short cervix.

## 2011-01-14 NOTE — ED Provider Notes (Signed)
Saw patient with Dr. Cristal Ford.   Reviewed her PN hx: UDS positive last month marijuana; hx LBW/SGA last baby: NSVD 36 wk, wt 3#8  Reviewed the following: urine culture from last week: neg 01/09/11 limited US: cx 2.2 cm dynamic with Valsalva and AFI 19.3 Ob Hx; P2: 36 wk, 3#8  EFM: UCs/UI abolished after oral procardia and IVF bolus, FHR 145-150 baseline, accels 5 bpm, mod variability  Plan [place] for growth (due to OB hx), BPP (due to nonreactive FHR with audible FM), UDS   POE,DEIRDRE 12:56 PM

## 2011-01-14 NOTE — Progress Notes (Signed)
Patient states she started having abdominal pain last night that is getting worse. Denies any bleeding or leaking and reports good fetal movement.

## 2011-01-16 ENCOUNTER — Encounter: Payer: Self-pay | Admitting: Obstetrics & Gynecology

## 2011-01-16 DIAGNOSIS — O0990 Supervision of high risk pregnancy, unspecified, unspecified trimester: Secondary | ICD-10-CM | POA: Insufficient documentation

## 2011-01-17 ENCOUNTER — Encounter (HOSPITAL_COMMUNITY): Payer: Self-pay | Admitting: *Deleted

## 2011-01-17 ENCOUNTER — Inpatient Hospital Stay (HOSPITAL_COMMUNITY)
Admission: AD | Admit: 2011-01-17 | Discharge: 2011-01-17 | Disposition: A | Discharge status: Home / Self Care | Payer: Self-pay | Source: Ambulatory Visit | Attending: Obstetrics and Gynecology | Admitting: Obstetrics and Gynecology

## 2011-01-17 DIAGNOSIS — O26899 Other specified pregnancy related conditions, unspecified trimester: Secondary | ICD-10-CM

## 2011-01-17 DIAGNOSIS — O4700 False labor before 37 completed weeks of gestation, unspecified trimester: Secondary | ICD-10-CM | POA: Insufficient documentation

## 2011-01-17 DIAGNOSIS — N949 Unspecified condition associated with female genital organs and menstrual cycle: Secondary | ICD-10-CM | POA: Insufficient documentation

## 2011-01-17 DIAGNOSIS — R109 Unspecified abdominal pain: Secondary | ICD-10-CM | POA: Insufficient documentation

## 2011-01-17 HISTORY — DX: Urinary tract infection, site not specified: N39.0

## 2011-01-17 LAB — URINALYSIS, ROUTINE W REFLEX MICROSCOPIC
Glucose, UA: NEGATIVE mg/dL
Ketones, ur: 15 mg/dL — AB
Protein, ur: NEGATIVE mg/dL
Urobilinogen, UA: 0.2 mg/dL (ref 0.0–1.0)

## 2011-01-17 LAB — URINE MICROSCOPIC-ADD ON

## 2011-01-17 MED ORDER — ACETAMINOPHEN 500 MG PO TABS
1000.0000 mg | ORAL_TABLET | Freq: Four times a day (QID) | ORAL | Status: DC | PRN
  Administered 2011-01-17: 1000 mg via ORAL
  Filled 2011-01-17: qty 2

## 2011-01-17 MED ORDER — DOCUSATE SODIUM 100 MG PO CAPS: 100.0000 mg | ORAL_CAPSULE | Freq: Every day | ORAL | Status: DC | PRN

## 2011-01-17 NOTE — ED Notes (Signed)
Feeling pressure - "like need to booboo"

## 2011-01-17 NOTE — ED Provider Notes (Signed)
Agree with above note.  LEGGETT,KELLY H. 01/17/2011 12:50 PM

## 2011-01-17 NOTE — ED Provider Notes (Addendum)
History     Chief Complaint  Patient presents with  . Abdominal Pain   HPI  20 yo G3P1102 at 32.2 weeks seen recently in MAU for contractions and lower pelvic pressure, presents with increased intensity of lower pelvic and suprapubic pain. Today around noon began having increased severity of pelvic pain/pressure and radiates to her buttocks. No known triggers. Pain not worsened or alleviated by anything. Has tried tylenol but not helping. She had one episode of emesis soon after pain started. Has a BM every other day, no diarrhea, hematemesis, dysuria, HA, swelling. Denies fever, chills, discharge, bleeding, LOF. Fetal activity is wnl. Last intercourse was 2 days ago.    On 01/09/11 was treated with macrobid and had positive FFN, received BTZ x 2 doses. Since that time her contractions are present intermittently and is using procardia q5-6 hours. Has a referral to [facility] for preterm contractions.  OB History    Grav Para Term Preterm Abortions TAB SAB Ect Mult Living   3 2 1 1      2       Past Medical History  Diagnosis Date  . Abnormal Pap smear   . Gonorrhea   . Trichomonas   . Syphilis   . Pyelonephritis   . Anemia   . Suicide attempt   . Urinary tract infection   . Depression     no meds currently, doing ok    Past Surgical History  Procedure Date  . Wisdom tooth extraction     Family History  Problem Relation Age of Onset  . Anesthesia problems Neg Hx     History  Substance Use Topics  . Smoking status: Current Everyday Smoker -- 0.2 packs/day for 13 years    Types: Cigarettes  . Smokeless tobacco: Never Used  . Alcohol Use: No    Allergies: No Known Allergies  Prescriptions prior to admission  Medication Sig Dispense Refill  . acetaminophen (TYLENOL) 500 MG tablet Take 500 mg by mouth every 6 (six) hours as needed. Patient takes for pain      . cephALEXin (KEFLEX) 500 MG capsule Take 500 mg by mouth 2 (two) times daily.        Marland Kitchen NIFEdipine (PROCARDIA) 10 MG  capsule Take 10 mg by mouth every 6 (six) hours as needed. contractions       . prenatal vitamin w/FE, FA (PRENATAL 1 + 1) 27-1 MG TABS Take 1 tablet by mouth daily.       Marland Kitchen DISCONTD: cephALEXin (KEFLEX) 500 MG capsule Take 1 capsule (500 mg total) by mouth 2 (two) times daily.  14 capsule  0    ROS Physical Exam   Blood pressure 114/62, pulse 94, temperature 98.5 F (36.9 C), temperature source Oral, resp. rate 22, SpO2 97.00%, unknown if currently breastfeeding.  Physical Exam  Vitals reviewed. Constitutional: She is oriented to person, place, and time. She appears well-developed and well-nourished.       Appears uncomfortable in fetal position, curled in bed  HENT:  Head: Normocephalic and atraumatic.  Cardiovascular: Normal rate and regular rhythm.   No murmur heard. Respiratory: Effort normal and breath sounds normal.  GI: Soft. There is tenderness. There is no guarding.       TTP in suprapubic area and left lower abdomen.   Musculoskeletal: She exhibits no edema and no tenderness.  Neurological: She is alert and oriented to person, place, and time.   SVE: 1cm/50/-1, vertex  MAU Course  Procedures  Toco: mild  irritability, no contractions FHTs: 145, no decels, moderate variability, reactive.  Pain has improved after 1g tylenol and is eating pretzels, smiling.   Assessment and Plan  20 yo G3P1102 at 32.2 weeks with pelvic/buttock pain and pressure, preterm contractions.  -No contractions or cervical change to indicate labor. FFN less than 23 weeks old. GBS negative.  -Recent cervical cultures and wet prep negative. -UTI. Finishing treatment with keflex after today, UA is contaminated. Culture was negative previously. May need test of cure at clinic next week. -Continue tylenol prn and discussed stretching techniques for likely ligamental pain, colace for constipation. -F/u in [facility] as scheduled, procardia prn for contractions.  Case d/w Sid Falcon, CNM.    Darrin Apodaca 01/17/2011, 2:06 PM

## 2011-01-17 NOTE — Progress Notes (Signed)
Sudden onset of pain in lower abd- into privates. Denies bleeding or leaking. Was eating at time of onset.  Became nauseated after onset, vomited x1, denies diarrhea

## 2011-01-17 NOTE — ED Provider Notes (Signed)
Agree with above note.  Pamela Bridges H. 01/17/2011 12:49 PM

## 2011-01-17 NOTE — ED Notes (Signed)
Brought to rm from lobby via wc

## 2011-01-18 LAB — URINE CULTURE
Colony Count: NO GROWTH
Culture  Setup Time: 201211160145
Culture: NO GROWTH

## 2011-01-21 ENCOUNTER — Ambulatory Visit (INDEPENDENT_AMBULATORY_CARE_PROVIDER_SITE_OTHER): Payer: Self-pay | Admitting: Obstetrics & Gynecology

## 2011-01-21 DIAGNOSIS — O0990 Supervision of high risk pregnancy, unspecified, unspecified trimester: Secondary | ICD-10-CM

## 2011-01-21 DIAGNOSIS — O479 False labor, unspecified: Secondary | ICD-10-CM | POA: Insufficient documentation

## 2011-01-21 DIAGNOSIS — O09219 Supervision of pregnancy with history of pre-term labor, unspecified trimester: Secondary | ICD-10-CM

## 2011-01-21 DIAGNOSIS — O280 Abnormal hematological finding on antenatal screening of mother: Secondary | ICD-10-CM

## 2011-01-21 DIAGNOSIS — O99330 Smoking (tobacco) complicating pregnancy, unspecified trimester: Secondary | ICD-10-CM | POA: Insufficient documentation

## 2011-01-21 DIAGNOSIS — O289 Unspecified abnormal findings on antenatal screening of mother: Secondary | ICD-10-CM

## 2011-01-21 LAB — POCT URINALYSIS DIP (DEVICE)
Leukocytes, UA: NEGATIVE
Nitrite: NEGATIVE
Protein, ur: NEGATIVE mg/dL
Urobilinogen, UA: 0.2 mg/dL (ref 0.0–1.0)
pH: 7 (ref 5.0–8.0)

## 2011-01-21 NOTE — Progress Notes (Signed)
Pt had 1 hr gtt at HD result 80

## 2011-01-21 NOTE — Patient Instructions (Signed)
Pregnancy and Smoking Smoking during pregnancy is very unhealthy for the mother and the developing fetus. The addictive drug in cigarettes (nicotine), carbon monoxide, and many other poisons are inhaled from a cigarette and are carried through your bloodstream to your fetus. Cigarette smoke contains more than 2,500 chemicals. It is not known which of these chemicals are harmful to the developing fetus. However, both nicotine and carbon monoxide play a role in causing health problems in pregnancy. Effects on the fetus of smoking during pregnancy:  Decrease in blood flow and oxygen to the uterus, placenta, and your fetus.   Increased heart rate of the fetus.   Slowing of your fetus's growth in the uterus (intrauterine growth retardation).   Placental problems. Placenta may partially cover or completely cover the opening to the cervix (placenta previa), or the placenta may partially or completely separate from the uterus (placental abruption).   Increase risk of pregnancy outside of the uterus (tubal pregnancy).   Premature rupture membranes, causing the sac that holds the fetus to break too early, resulting in premature birth and increased health risks to the newborn.   Increased risk of birth defects, including heart defects.   Increased risk of miscarriage.  Newborns born to women who smoke during pregnancy:  Are more likely to be born too early (prematurely).   Are more likely to be at a low birth weight.   Are at risk for serious health problems, chronic or lifelong disabilities (cerebral palsy, mental retardation, learning problems), and possibly even death   Are at risk of Sudden Infant Death Syndrome (SIDS).   Have higher rates of miscarriage and stillbirth.   Have more lung and breathing (respiratory) problems.  Long-term effects on a child's behavior: Some of the following trends are seen with children of smoking mothers:  Increased risk for drug abuse, behavior, and conduct  disorders.   Increased risk for smoking in adolescent girls.   Increased risk for negative behavior in 2-year-olds.   Increase risk for asthma, colic, and childhood obesity, which can lead to diabetes.   Increased risk for finger and toe disorders.  Resources to stop smoking during pregnancy:  Counseling.   Psychological treatment.   Acupuncture.   Family intervention.   Hypnosis.   Medicines that are safe to take during pregnancy. Nicotine supplements have not been studied enough. They should only be considered when all other methods fail.   Telephone QUIT lines.  Smoking and Breastfeeding:  Nicotine gets passed to the infant through a mother's breastmilk. This can cause nausea, colic, cramping, and diarrhea in the infant.   Smoking may reduce milk supply and interfere with the let-down response.   Even formula-fed infants of mothers who smoke have nicotine and cotinine (nicotine by-product) in their urine.  Other resources to help stop smoking:  American Cancer Society: www.cancer.org   American Heart Association: www.americanheart.org   National Cancer Institute: www.cancer.gov   Smoke Free Families: www.smokefreefamilies.[address] [place] Line): 541-129-2147 START  Document Released: 07/02/2004 Document Revised: 10/31/2010 Document Reviewed: 11/30/2008 [facility] Patient Information 2012 [place], [place].

## 2011-01-21 NOTE — Progress Notes (Signed)
Referred to [facility] for preterm contractions this pregnancy, on procardia.  History of preterm delivery 2/2 IUGR at 36 weeks, had subsequent term pregnancy.  Denies contractions, vaginal bleeding or leaking of fluid.  Reports good fetal movement. No other complaints.  Fetal movement and preterm labor precautions reviewed. Return to clinic in  2 weeks.  Continue Procardia as needed.  Patient smokes 3 cigarettes/day, recommended Nicotine patch but she declined.  Reviewed risks of tobacco smoking in pregnancy.

## 2011-01-23 ENCOUNTER — Inpatient Hospital Stay (HOSPITAL_COMMUNITY)
Admission: AD | Admit: 2011-01-23 | Discharge: 2011-01-24 | DRG: 778 | Disposition: A | Discharge status: Home / Self Care | Payer: Medicaid Other | Source: Ambulatory Visit | Attending: Obstetrics & Gynecology | Admitting: Obstetrics & Gynecology

## 2011-01-23 ENCOUNTER — Encounter (HOSPITAL_COMMUNITY): Payer: Self-pay | Admitting: *Deleted

## 2011-01-23 DIAGNOSIS — O479 False labor, unspecified: Secondary | ICD-10-CM

## 2011-01-23 DIAGNOSIS — O99330 Smoking (tobacco) complicating pregnancy, unspecified trimester: Secondary | ICD-10-CM

## 2011-01-23 DIAGNOSIS — O4700 False labor before 37 completed weeks of gestation, unspecified trimester: Secondary | ICD-10-CM

## 2011-01-23 LAB — URINALYSIS, ROUTINE W REFLEX MICROSCOPIC
Bilirubin Urine: NEGATIVE
Glucose, UA: NEGATIVE mg/dL
Hgb urine dipstick: NEGATIVE
Ketones, ur: 15 mg/dL — AB
Protein, ur: NEGATIVE mg/dL
Urobilinogen, UA: 0.2 mg/dL (ref 0.0–1.0)

## 2011-01-23 LAB — CBC
HCT: 28.6 % — ABNORMAL LOW (ref 36.0–46.0)
MCH: 29 pg (ref 26.0–34.0)
MCV: 89.1 fL (ref 78.0–100.0)
RDW: 13.3 % (ref 11.5–15.5)
WBC: 7.9 10*3/uL (ref 4.0–10.5)

## 2011-01-23 LAB — MAGNESIUM: Magnesium: 3.9 mg/dL — ABNORMAL HIGH (ref 1.5–2.5)

## 2011-01-23 MED ORDER — NIFEDIPINE 10 MG PO CAPS
30.0000 mg | ORAL_CAPSULE | Freq: Once | ORAL | Status: AC
  Administered 2011-01-23: 30 mg via ORAL
  Filled 2011-01-23: qty 3

## 2011-01-23 MED ORDER — LACTATED RINGERS IV BOLUS (SEPSIS)
1000.0000 mL | Freq: Once | INTRAVENOUS | Status: AC
  Administered 2011-01-23: 1000 mL via INTRAVENOUS

## 2011-01-23 MED ORDER — LACTATED RINGERS IV SOLN
INTRAVENOUS | Status: DC
  Administered 2011-01-23: 23:00:00 via INTRAVENOUS

## 2011-01-23 MED ORDER — MAGNESIUM SULFATE BOLUS VIA INFUSION
4.0000 g | Freq: Once | INTRAVENOUS | Status: AC
  Administered 2011-01-23: 4 g via INTRAVENOUS
  Filled 2011-01-23: qty 500

## 2011-01-23 MED ORDER — MAGNESIUM SULFATE 40 G IN LACTATED RINGERS - SIMPLE
2.0000 g/h | INTRAVENOUS | Status: DC
  Filled 2011-01-23: qty 500

## 2011-01-23 MED ORDER — LACTATED RINGERS IV SOLN
INTRAVENOUS | Status: DC
  Administered 2011-01-23: 15:00:00 via INTRAVENOUS

## 2011-01-23 MED ORDER — GUAIFENESIN 100 MG/5ML PO SOLN
5.0000 mL | ORAL | Status: DC | PRN
  Administered 2011-01-23 – 2011-01-24 (×2): 100 mg via ORAL
  Filled 2011-01-23 (×2): qty 15

## 2011-01-23 NOTE — Progress Notes (Signed)
Patient ID: Pamela Bridges, female   DOB: [birth year], 20 y.o.   MRN: [id number] Patient admitted for management of preterm labor, previously on Procardia for threatened preterm labor. Magnesium sulfate IV currently running with significant reduction of contraction frequency. Will order Magnesium level. [name]

## 2011-01-23 NOTE — ED Provider Notes (Signed)
Chief Complaint:  Contractions  Pamela Bridges is  20 y.o. [id number].  Her pregnancy status is positive. [redacted]w[redacted]d  She presents complaining of No chief complaint on file. . Onset is described as sudden and has been present since 600 today  Obstetrical/Gynecological History: OB History    Grav Para Term Preterm Abortions TAB SAB Ect Mult Living   3 2 1 1      2       Past Medical History: Past Medical History  Diagnosis Date  . Abnormal Pap smear   . Gonorrhea   . Trichomonas   . Syphilis   . Pyelonephritis   . Anemia   . Suicide attempt   . Urinary tract infection   . Depression     no meds currently, doing ok    Past Surgical History: Past Surgical History  Procedure Date  . Wisdom tooth extraction     Family History: Family History  Problem Relation Age of Onset  . Anesthesia problems Neg Hx     Social History: History  Substance Use Topics  . Smoking status: Current Everyday Smoker -- 0.2 packs/day for 13 years    Types: Cigarettes  . Smokeless tobacco: Never Used  . Alcohol Use: No    Allergies:  Allergies  Allergen Reactions  . Eggs Or Egg-Derived Products Nausea And Vomiting    No problems with vaccines.    Prescriptions prior to admission  Medication Sig Dispense Refill  . prenatal vitamin w/FE, FA (PRENATAL 1 + 1) 27-1 MG TABS Take 1 tablet by mouth daily.         Review of Systems - Positive for ctxs, LOF.  Physical Exam   Blood pressure 124/76, pulse 98, temperature 98.7 F (37.1 C), temperature source Oral, resp. rate 20, unknown if currently breastfeeding.  General: General appearance - alert, well appearing, and in no distress, oriented to person, place, and time and crying Mental status - alert, oriented to person, place, and time, normal mood, behavior, speech, dress, motor activity, and thought processes, uncomfortable Abdomen - gravid, nontender Focused Gynecological Exam: Chux soak, Fern neg, no pooling SVE: 2/70/vtx/-2; BOW  palp  Labs: No results found for this or any previous visit (from the past 24 hour(s)). ED course: Will obtain amnisure and start IVF   Assessment: Preterm Contractions Patient Active Problem List  Diagnoses  . Abnormal MSAFP (maternal serum alpha-fetoprotein), elevated  . Supervision of high-risk pregnancy  . Hx of preterm delivery, currently pregnant  . Tobacco smoking complicating pregnancy  . Preterm contractions    Plan: CAre assumed by Pamela Bridges, CNM  Chang Tiggs E. 01/23/2011,9:52 AM

## 2011-01-23 NOTE — ED Notes (Signed)
No adverse effect from Procardia 30mg , pt states she still feels the same amount of pain.

## 2011-01-24 LAB — RAPID URINE DRUG SCREEN, HOSP PERFORMED
Amphetamines: NOT DETECTED
Cocaine: NOT DETECTED
Opiates: NOT DETECTED
Tetrahydrocannabinol: POSITIVE — AB

## 2011-01-24 MED ORDER — NIFEDIPINE ER OSMOTIC RELEASE 30 MG PO TB24: 30.0000 mg | ORAL_TABLET | Freq: Every day | ORAL | Status: DC

## 2011-01-24 NOTE — Progress Notes (Signed)
Not total

## 2011-01-24 NOTE — Discharge Summary (Signed)
Physician Discharge Summary  Patient ID: Pamela Bridges MRN: [id number] DOB/AGE: [birth year] 20 y.o.  Admit date: 01/23/2011 Discharge date: 01/24/2011  Admission Diagnoses:  Discharge Diagnoses:  Active Problems:  * No active hospital problems. *    Discharged Condition: good  Hospital Course: admitted for Magnesium Sulfate x 12+ HOURS  for neuroprophylaxis. Prior therapy with BMZ completed 2 weeks ago.  Consults: none    Treatments: Magnesium Sulfate 2gm / hr x 12 hours.  Discharge Exam: Blood pressure 109/62, pulse 94, temperature 97.9 F (36.6 C), temperature source Oral, resp. rate 20, height 5\' 6"  (1.676 m), weight 73.8 kg (162 lb 11.2 oz), SpO2 97.00%. GI: gravid nontender uterus, without discomfort on exam Pelvic: exams while in hospital showed no change, per CNM  Disposition: Home or Self Care  Discharge Orders    Future Appointments: Provider: Department: Dept Phone: Center:   02/04/2011 9:00 AM [facility] [phone] [facility]     Future Orders Please Complete By Expires   Diet - low sodium heart healthy      Discharge instructions      Comments:   Limit home activities , bedrest 2hrs twice daily, more if contractions noted.   Increase activity slowly      Sexual Activity Restrictions      Comments:   We recommend avoiding sexual activity until beyond 34 weeks.   Call MD for:  temperature >100.4      Call MD for:  severe uncontrolled pain      HIV antibody      Comments:   This external order was created through the Results Console.   Rubella antibody, IgM      Comments:   This external order was created through the Results Console.   Hepatitis B surface antigen      Comments:   This external order was created through the Results Console.   RPR      Comments:   This external order was created through the Results Console.   Antibody screen      Comments:   This external order was created through the Results  Console.     Current Discharge Medication List    START taking these medications   Details  NIFEdipine (PROCARDIA XL) 30 MG 24 hr tablet Take 1 tablet (30 mg total) by mouth daily. Qty: 20 tablet, Refills: 0      CONTINUE these medications which have NOT CHANGED   Details  prenatal vitamin w/FE, FA (PRENATAL 1 + 1) 27-1 MG TABS Take 1 tablet by mouth daily.          SignedTilda Burrow 01/24/2011, 6:50 AM

## 2011-01-24 NOTE — Progress Notes (Signed)
No adverse effect from procardia, pt still reports pain 10/10.

## 2011-01-24 NOTE — Progress Notes (Signed)
FACULTY PRACTICE ANTEPARTUM(COMPREHENSIVE) NOTE  Pamela Bridges is a 20 y.o. Z6X0960 at [redacted]w[redacted]d by best clinical estimate who is admitted for Preterm labor.   Fetal presentation is cephalic. Length of Stay:  1  Days  Subjective: Patient has had minimal contractions through the night. She has been smoking in the bathroom. . Patient reports the fetal movement as active. Patient reports uterine contraction  activity as none. Patient reports  vaginal bleeding as none. Patient describes fluid per vagina as None.  Vitals:  Blood pressure 109/62, pulse 94, temperature 97.9 F (36.6 C), temperature source Oral, resp. rate 20, height 5\' 6"  (1.676 m), weight 73.8 kg (162 lb 11.2 oz), SpO2 97.00%. Physical Examination:  General appearance - alert, well appearing, and in no distress  Fundal Height:  size equals dates Cervical Exam: Not evaluated. Was checked earlier and no change between admission and last check. , no sign of DVT with DTRs 2+ bilaterally Membranes:intact  Fetal Monitoring:  Baseline: 140 bpm and Accelerations: Reactive  Labs:  Recent Results (from the past 24 hour(s))  AMNISURE RUPTURE OF MEMBRANE (ROM)   Collection Time   01/23/11 10:15 AM      Component Value Range   Amnisure ROM NEGATIVE    URINALYSIS, ROUTINE W REFLEX MICROSCOPIC   Collection Time   01/23/11 12:00 PM      Component Value Range   Color, Urine YELLOW  YELLOW    Appearance CLEAR  CLEAR    Specific Gravity, Urine 1.010  1.005 - 1.030    pH 7.0  5.0 - 8.0    Glucose, UA NEGATIVE  NEGATIVE (mg/dL)   Hgb urine dipstick NEGATIVE  NEGATIVE    Bilirubin Urine NEGATIVE  NEGATIVE    Ketones, ur 15 (*) NEGATIVE (mg/dL)   Protein, ur NEGATIVE  NEGATIVE (mg/dL)   Urobilinogen, UA 0.2  0.0 - 1.0 (mg/dL)   Nitrite NEGATIVE  NEGATIVE    Leukocytes, UA NEGATIVE  NEGATIVE   CBC   Collection Time   01/23/11  2:45 PM      Component Value Range   WBC 7.9  4.0 - 10.5 (K/uL)   RBC 3.21 (*) 3.87 - 5.11  (MIL/uL)   Hemoglobin 9.3 (*) 12.0 - 15.0 (g/dL)   HCT 45.4 (*) 09.8 - 46.0 (%)   MCV 89.1  78.0 - 100.0 (fL)   MCH 29.0  26.0 - 34.0 (pg)   MCHC 32.5  30.0 - 36.0 (g/dL)   RDW 11.9  14.7 - 82.9 (%)   Platelets 215  150 - 400 (K/uL)  URINE RAPID DRUG SCREEN (HOSP PERFORMED)   Collection Time   01/23/11  6:41 PM      Component Value Range   Opiates NONE DETECTED  NONE DETECTED    Cocaine NONE DETECTED  NONE DETECTED    Benzodiazepines NONE DETECTED  NONE DETECTED    Amphetamines NONE DETECTED  NONE DETECTED    Tetrahydrocannabinol POSITIVE (*) NONE DETECTED    Barbiturates NONE DETECTED  NONE DETECTED   MAGNESIUM   Collection Time   01/23/11  7:10 PM      Component Value Range   Magnesium 3.9 (*) 1.5 - 2.5 (mg/dL)    I Medications:  Scheduled    . lactated ringers  1,000 mL Intravenous Once  . magnesium  4 g Intravenous Once  . NIFEdipine  30 mg Oral Once   I have reviewed the patient's current medications.  ASSESSMENT: Patient Active Problem List  Diagnoses  . Abnormal  MSAFP (maternal serum alpha-fetoprotein), elevated  . Supervision of high-risk pregnancy  . Hx of preterm delivery, currently pregnant  . Tobacco smoking complicating pregnancy  . Preterm contractions    PLAN: Discontinue Mag sulfate, discharge home, continue at home Procardia XL 30 bid til 34 weeks. followup dec 3 at [facility]. Brandii Lakey V 01/24/2011,6:35 AM

## 2011-01-27 NOTE — H&P (Signed)
  Chief Complaint: Contractions  Pamela Bridges is 20 y.o. [id number]. Her pregnancy status is positive. [redacted]w[redacted]d She presents complaining of No chief complaint on file.  .  Onset is described as sudden and has been present since 600 today  Obstetrical/Gynecological History:  OB History    Grav  Para  Term  Preterm  Abortions  TAB  SAB  Ect  Mult  Living    3  2  1  1       2       Past Medical History:  Past Medical History   Diagnosis  Date   .  Abnormal Pap smear    .  Gonorrhea    .  Trichomonas    .  Syphilis    .  Pyelonephritis    .  Anemia    .  Suicide attempt    .  Urinary tract infection    .  Depression      no meds currently, doing ok    Past Surgical History:  Past Surgical History   Procedure  Date   .  Wisdom tooth extraction     Family History:  Family History   Problem  Relation  Age of Onset   .  Anesthesia problems  Neg Hx     Social History:  History   Substance Use Topics   .  Smoking status:  Current Everyday Smoker -- 0.2 packs/day for 13 years     Types:  Cigarettes   .  Smokeless tobacco:  Never Used   .  Alcohol Use:  No    Allergies:  Allergies   Allergen  Reactions   .  Eggs Or Egg-Derived Products  Nausea And Vomiting     No problems with vaccines.    Prescriptions prior to admission   Medication  Sig  Dispense  Refill   .  prenatal vitamin w/FE, FA (PRENATAL 1 + 1) 27-1 MG TABS  Take 1 tablet by mouth daily.      Review of Systems - Positive for ctxs, LOF.  Physical Exam   Blood pressure 124/76, pulse 98, temperature 98.7 F (37.1 C), temperature source Oral, resp. rate 20, unknown if currently breastfeeding.  General: General appearance - alert, well appearing, and in no distress, oriented to person, place, and time and crying  Mental status - alert, oriented to person, place, and time, normal mood, behavior, speech, dress, motor activity, and thought processes, uncomfortable  Abdomen - gravid, nontender  Focused  Gynecological Exam: Chux soak, Fern neg, no pooling  SVE: 2/70/vtx/-2; BOW palp  Labs:  No results found for this or any previous visit (from the past 24 hour(s)).  ED course: Will obtain amnisure and start IVF  Assessment: Preterm Contractions  Patient Active Problem List   Diagnoses   .  Abnormal MSAFP (maternal serum alpha-fetoprotein), elevated   .  Supervision of high-risk pregnancy   .  Hx of preterm delivery, currently pregnant   .  Tobacco smoking complicating pregnancy   .  Preterm contractions    Plan:  CAre assumed by Wynelle Bourgeois, CNM  SHORES,SUZANNE E.  01/23/2011,9:52 AM    Patient admitted for management of preterm labor Pamela Bridges

## 2011-01-28 ENCOUNTER — Telehealth: Payer: Self-pay | Admitting: *Deleted

## 2011-01-28 ENCOUNTER — Inpatient Hospital Stay (HOSPITAL_COMMUNITY)
Admission: AD | Admit: 2011-01-28 | Discharge: 2011-01-28 | Disposition: A | Discharge status: Home / Self Care | Payer: Medicaid Other | Source: Ambulatory Visit | Attending: Obstetrics & Gynecology | Admitting: Obstetrics & Gynecology

## 2011-01-28 ENCOUNTER — Encounter (HOSPITAL_COMMUNITY): Payer: Self-pay | Admitting: *Deleted

## 2011-01-28 DIAGNOSIS — O26899 Other specified pregnancy related conditions, unspecified trimester: Secondary | ICD-10-CM

## 2011-01-28 DIAGNOSIS — O09219 Supervision of pregnancy with history of pre-term labor, unspecified trimester: Secondary | ICD-10-CM

## 2011-01-28 DIAGNOSIS — O99330 Smoking (tobacco) complicating pregnancy, unspecified trimester: Secondary | ICD-10-CM

## 2011-01-28 DIAGNOSIS — N898 Other specified noninflammatory disorders of vagina: Secondary | ICD-10-CM | POA: Insufficient documentation

## 2011-01-28 DIAGNOSIS — O99891 Other specified diseases and conditions complicating pregnancy: Secondary | ICD-10-CM | POA: Insufficient documentation

## 2011-01-28 DIAGNOSIS — O479 False labor, unspecified: Secondary | ICD-10-CM

## 2011-01-28 DIAGNOSIS — R109 Unspecified abdominal pain: Secondary | ICD-10-CM | POA: Insufficient documentation

## 2011-01-28 LAB — WET PREP, GENITAL: Clue Cells Wet Prep HPF POC: NONE SEEN

## 2011-01-28 MED ORDER — OXYCODONE-ACETAMINOPHEN 5-325 MG PO TABS
2.0000 | ORAL_TABLET | Freq: Once | ORAL | Status: AC
  Administered 2011-01-28: 2 via ORAL
  Filled 2011-01-28: qty 2

## 2011-01-28 NOTE — Progress Notes (Signed)
Pt states the whole family has had a cough without fever for two weeks.

## 2011-01-28 NOTE — Telephone Encounter (Signed)
Pt called stating was having cramping and abdominal pain that started this am. Pt reports pain level at a 10. No leaking of fluid but pt does states that has white vaginal discharge with no odor. Baby is moving well.  Pt to report to MAU for evaluation. Pt voiced understanding.

## 2011-01-28 NOTE — Progress Notes (Signed)
Pt c/o constant  upper abd pain since this morning.  Tender upon palpation.  No bleeding or leaking.

## 2011-01-28 NOTE — ED Provider Notes (Addendum)
Chief Complaint:  Upper abdominal pain  Zuleica KOURTLYNN TREVOR is  20 y.o. [id number] at [redacted]w[redacted]d presents complaining of upper abdominal pain.  She was ecently admitted on 11/21 for concern about preterm labor and was discharged to home after negative evaluation.  She states constant upper abdominal pain since early this morning; also reports having thick, white discharge.  No denies any contractions, vaginal bleeding, ruptured membranes.  Endorses good fetal movement.  Obstetrical/Gynecological History: Menstrual History: OB History    Grav Para Term Preterm Abortions TAB SAB Ect Mult Living   3 2 1 1      2       Past Medical History: Past Medical History  Diagnosis Date  . Abnormal Pap smear   . Gonorrhea   . Trichomonas   . Syphilis   . Pyelonephritis   . Anemia   . Suicide attempt   . Urinary tract infection   . Depression     no meds currently, doing ok    Past Surgical History: Past Surgical History  Procedure Date  . Wisdom tooth extraction 2005    Family History: Family History  Problem Relation Age of Onset  . Anesthesia problems Neg Hx   . Learning disabilities Daughter     Social History: History  Substance Use Topics  . Smoking status: Current Everyday Smoker -- 0.2 packs/day for 13 years    Types: Cigarettes  . Smokeless tobacco: Never Used  . Alcohol Use: No     pt denies any other drug use other than THC     Allergies:  Allergies  Allergen Reactions  . Eggs Or Egg-Derived Products Nausea And Vomiting    No problems with vaccines.    Meds:  Prescriptions prior to admission  Medication Sig Dispense Refill  . NIFEdipine (PROCARDIA XL) 30 MG 24 hr tablet Take 1 tablet (30 mg total) by mouth daily.  20 tablet  0  . prenatal vitamin w/FE, FA (PRENATAL 1 + 1) 27-1 MG TABS Take 1 tablet by mouth daily.         Review of Systems - Please refer to the aforementioned patients' reports.     Physical Exam  Blood pressure 123/73, pulse 70, temperature  98.3 F (36.8 C), temperature source Oral, resp. rate 16. GENERAL: Well-developed, well-nourished female in no acute distress.  LUNGS: Clear to auscultation bilaterally.  HEART: Regular rate and rhythm. ABDOMEN: Mild upper fundal tenderness to palpation, no other tenderness elicited in other parts of the fundus.  Fetal head/bottom palpated at the site of tenderness. No stomach or liver tenderness. No epigastric or back pain. EXTREMITIES: Nontender, no edema, 2+ distal pulses. Cervical Exam: Dilatation 1-2 cm   Effacement 50%   Station -3.  Thick white discharge noted.  FFN, wet prep, GC/Chlam, GBS obtained. FHT:  Baseline rate 140 bpm   Variability moderate  Accelerations present   Decelerations none Contractions: None   Labs: Recent Results (from the past 24 hour(s))  WET PREP, GENITAL   Collection Time   01/28/11  6:50 PM      Component Value Range   Yeast, Wet Prep NONE SEEN  NONE SEEN    Trich, Wet Prep NONE SEEN  NONE SEEN    Clue Cells, Wet Prep NONE SEEN  NONE SEEN    WBC, Wet Prep HPF POC MODERATE BACTERIA SEEN (*) NONE SEEN   FETAL FIBRONECTIN   Collection Time   01/28/11  6:50 PM      Component Value Range  Fetal Fibronectin POSITIVE (*) NEGATIVE     Assessment: MAYE PARKINSON is  20 y.o. [id number] at [redacted]w[redacted]d presents with upper abdominal/fundal pain and vaginal discharge.  Plan: Pain likely secondary to fetal position; no signs of chorioamnionitis or preterm labor.   No other sign of visceral pain.  Patient was given two tablets of Percocet; feels "a lot better". Wet prep with leukorrhea; FFN positive but has been positive in the past.  No signs/symptoms of preterm labor, and patient is already on Procardia and received BMZ about three weeks ago. No indication for further observation. Discharged to home with labor and fetal movement precuations.   Follow up with OB provider as scheduled.  Barrett Goldie A 11/26/20126:51 PM

## 2011-01-28 NOTE — ED Notes (Signed)
Pt brought directly to rm for eval

## 2011-01-29 LAB — GC/CHLAMYDIA PROBE AMP, GENITAL
Chlamydia, DNA Probe: NEGATIVE
GC Probe Amp, Genital: NEGATIVE

## 2011-01-31 LAB — CULTURE, BETA STREP (GROUP B ONLY)

## 2011-02-04 ENCOUNTER — Ambulatory Visit (INDEPENDENT_AMBULATORY_CARE_PROVIDER_SITE_OTHER): Payer: Self-pay | Admitting: Family Medicine

## 2011-02-04 DIAGNOSIS — O09219 Supervision of pregnancy with history of pre-term labor, unspecified trimester: Secondary | ICD-10-CM

## 2011-02-04 DIAGNOSIS — O289 Unspecified abnormal findings on antenatal screening of mother: Secondary | ICD-10-CM

## 2011-02-04 DIAGNOSIS — O280 Abnormal hematological finding on antenatal screening of mother: Secondary | ICD-10-CM

## 2011-02-04 DIAGNOSIS — O479 False labor, unspecified: Secondary | ICD-10-CM

## 2011-02-04 LAB — POCT URINALYSIS DIP (DEVICE)
Hgb urine dipstick: NEGATIVE
Ketones, ur: NEGATIVE mg/dL
Leukocytes, UA: NEGATIVE
Protein, ur: NEGATIVE mg/dL
pH: 7 (ref 5.0–8.0)

## 2011-02-04 MED ORDER — PRENATAL PLUS 27-1 MG PO TABS: 1.0000 | ORAL_TABLET | Freq: Every day | ORAL | Status: DC

## 2011-02-04 NOTE — Progress Notes (Signed)
Doing well 

## 2011-02-04 NOTE — Patient Instructions (Addendum)
Normal Labor and Delivery Your caregiver must first be sure you are in labor. Signs of labor include:  You may pass what is called "the mucus plug" before labor begins. This is a small amount of blood stained mucus.   Regular uterine contractions.   The time between contractions get closer together.   The discomfort and pain gradually gets more intense.   Pains are mostly located in the back.   Pains get worse when walking.   The cervix (the opening of the uterus becomes thinner (begins to efface) and opens up (dilates).  Once you are in labor and admitted into the hospital or care center, your caregiver will do the following:  A complete physical examination.   Check your vital signs (blood pressure, pulse, temperature and the fetal heart rate).   Do a vaginal examination (using a sterile glove and lubricant) to determine:   The position (presentation) of the baby (head [vertex] or buttock first).   The level (station) of the baby's head in the birth canal.   The effacement and dilatation of the cervix.   You may have your pubic hair shaved and be given an enema depending on your caregiver and the circumstance.   An electronic monitor is usually placed on your abdomen. The monitor follows the length and intensity of the contractions, as well as the baby's heart rate.   Usually, your caregiver will insert an IV in your arm with a bottle of sugar water. This is done as a precaution so that medications can be given to you quickly during labor or delivery.  NORMAL LABOR AND DELIVERY IS DIVIDED UP INTO 3 STAGES: First Stage This is when regular contractions begin and the cervix begins to efface and dilate. This stage can last from 3 to 15 hours. The end of the first stage is when the cervix is 100% effaced and 10 centimeters dilated. Pain medications may be given by   Injection (morphine, demerol, etc.)   Regional anesthesia (spinal, caudal or epidural, anesthetics given in  different locations of the spine). Paracervical pain medication may be given, which is an injection of and anesthetic on each side of the cervix.  A pregnant woman may request to have "Natural Childbirth" which is not to have any medications or anesthesia during her labor and delivery. Second Stage This is when the baby comes down through the birth canal (vagina) and is born. This can take 1 to 4 hours. As the baby's head comes down through the birth canal, you may feel like you are going to have a bowel movement. You will get the urge to bear down and push until the baby is delivered. As the baby's head is being delivered, the caregiver will decide if an episiotomy (a cut in the perineum and vagina area) is needed to prevent tearing of the tissue in this area. The episiotomy is sewn up after the delivery of the baby and placenta. Sometimes a mask with nitrous oxide is given for the mother to breath during the delivery of the baby to help if there is too much pain. The end of Stage 2 is when the baby is fully delivered. Then when the umbilical cord stops pulsating it is clamped and cut. Third Stage The third stage begins after the baby is completely delivered and ends after the placenta (afterbirth) is delivered. This usually takes 5 to 30 minutes. After the placenta is delivered, a medication is given either by intravenous or injection to help contract   the uterus and prevent bleeding. The third stage is not painful and pain medication is usually not necessary. If an episiotomy was done, it is repaired at this time. After the delivery, the mother is watched and monitored closely for 1 to 2 hours to make sure there is no postpartum bleeding (hemorrhage). If there is a lot of bleeding, medication is given to contract the uterus and stop the bleeding. Document Released: 11/28/2007 Document Revised: 10/31/2010 Document Reviewed: 11/28/2007 [employer] Patient Information 2012 [employer]. Birth Control  Choices Birth control is the use of any practices, methods, or devices to prevent pregnancy from happening in a sexually active woman.  Below are some birth control choices to help avoid pregnancy.  Not having sex (abstinence) is the surest form of birth control. This requires self-control. There is no risk of acquiring a sexually transmitted disease (STD), including acquired immunodeficiency syndrome (AIDS).   Periodic abstinence requires self-control during certain times of the month.   Calendar method, timing your menstrual periods from month to month.   Ovulation method is avoiding sexual intercourse around the time you produce an egg (ovulate).   Symptotherm method is avoiding sexual intercourse at the time of ovulation, using a thermometer and ovulation symptoms.   Post ovulation method is the timing of sexual intercourse after you ovulated.  These methods do not protect against STDs, including AIDS.  Birth control pills (BCPs) contain estrogen and progesterone hormone. These medicines work by stopping the egg from forming in the ovary (ovulation). Birth control pills are prescribed by a caregiver who will ask you questions about the risks of taking BCPs. Birth control pills do not protect against STDs, including AIDS.   "Minipill" birth control pills have only the progesterone hormone. They are taken every day of each month and must be prescribed by your caregiver. They do not protect against STDs, including AIDS.   Emergency contraception is often call the "morning after" pill. This pill can be taken right after sex or up to five days after sex if you think your birth control failed, you failed to use contraception, or you were forced to have sex. It is most effective the sooner you take the pills after having sexual intercourse. Do not use emergency contraception as your only form of birth control. Emergency contraceptive pills are available without a prescription. Check with your  pharmacist.   Condoms are a thin sheath of latex, synthetic material, or lambskin worn over the penis during sexual intercourse. They can have a spermicide in or on them when you buy them. Latex condoms can prevent pregnancy and STDs. "Natural" or lambskin condoms can prevent pregnancy but may not protect against STDs, including AIDS.   Female condoms are a soft, loose-fitting sheath that is put into the vagina before sexual intercourse. They can prevent pregnancy and STDs, including AIDS.   Sponge is a soft, circular piece of polyurethane foam with spermicide in it that is inserted into the vagina after wetting it and before sexual intercourse. It does not require a prescription from your caregiver. It does not protect against STDs, including AIDS.   Diaphragm is a soft, latex, dome-shaped barrier that must be fitted by a caregiver. It is inserted into the vagina, along with a spermicidal jelly. After the proper fitting for a diaphragm, always insert the diaphragm before intercourse. The diaphragm should be left in the vagina for 6 to 8 hours after intercourse. Removal and reinsertion with a spermicide is always necessary after any use. It   does not protect against STDs, including AIDS.   Progesterone-only injections are given every 3 months to prevent pregnancy. These injections contain synthetic progesterone and no estrogen. This hormone stops the ovaries from releasing eggs. It also causes the cervical mucus to thicken and changes the uterine lining. This makes it harder for sperm to survive in the uterus. It does not protect against STDs, including AIDS.   Birth Control Patch contains hormones similar to those in birth control pills, so effectiveness, risks, and side effects are similar. It must be changed once a week and is prescribed by a caregiver. It is less effective in very overweight women. It does not protect against STDs, including AIDS.   Vaginal Ring contains hormones similar to those in  birth control pills. It is left in place for 3 weeks, removed for 1 week, and then a new one is put back into the vagina. It comes with a timer to put in your purse to help you remember when to take it out or put a new one in. A caregiver's examination and prescription is necessary, just like with birth control pills and the patch. It does not protect against STDs, including AIDS.   Estrogen plus progesterone injections are given every 28 to 30 days. They can be given in the upper arm, thigh, or buttocks. It does not protect against STDs, including AIDS.   Intrauterine device (IUD): copper T or progestin filled is a T-shaped device that is put in a woman's uterus during a menstrual period to prevent pregnancy. The copper T IUD can last 10 years, and the progestin IUD can last 5 years. The progestin IUD can also help control heavy menstrual periods. It does not protect against STDs, including AIDS. The copper T IUD can be used as emergency contraception if inserted within 5 days of having unprotected intercourse.   Cervical cap is a round, soft latex or plastic cup that fits over the cervix and must be fitted by a caregiver. You do not need to use a spermicide with it or remove and insert it every time you have sexual intercourse. It does not protect against STDs, including AIDS.   Spermicides are chemicals that kill or block sperm from entering the cervix and uterus. They come in the form of creams, jellies, suppositories, foam, or tablets, and they do not require a prescription. They are inserted into the vagina with an applicator before having sexual intercourse. This must be repeated every time you have sexual intercourse.   Withdrawal is using the method of the female withdrawing his penis from sexual intercourse before he has a climax and deposits his sperm. It does not protect against STDs, including AIDS.   Female tubal ligation is when the woman's fallopian tubes are surgically sealed or tied to  prevent the egg from traveling to the uterus. It does not protect against STDs, including AIDS.   Female sterilization is when the female has his tubes that carry sperm tied off (vasectomy) to stop sperm from entering the vagina during sexual intercourse. It does not protect against STDs, including AIDS.  Regardless of which method of birth control you choose, it is still important that you use some form of protection against STDs. Document Released: 02/18/2005 Document Revised: 03/23/2010 Document Reviewed: 01/05/2009 [employer] Patient Information 2012 [employer]. Breastfeeding BENEFITS OF BREASTFEEDING For the baby  The first milk (colostrum) helps the baby's digestive system function better.   There are antibodies from the mother in the milk that help   the baby fight off infections.   The baby has a lower incidence of asthma, allergies, and SIDS (sudden infant death syndrome).   The nutrients in breast milk are better than formulas for the baby and helps the baby's brain grow better.   Babies who breastfeed have less gas, colic, and constipation.  For the mother  Breastfeeding helps develop a very special bond between mother and baby.   It is more convenient, always available at the correct temperature and cheaper than formula feeding.   It burns calories in the mother and helps with losing weight that was gained during pregnancy.   It makes the uterus contract back down to normal size faster and slows bleeding following delivery.   Breastfeeding mothers have a lower risk of developing breast cancer.  NURSE FREQUENTLY  A healthy, full-term baby may breastfeed as often as every hour or space his or her feedings to every 3 hours.   How often to nurse will vary from baby to baby. Watch your baby for signs of hunger, not the clock.   Nurse as often as the baby requests, or when you feel the need to reduce the fullness of your breasts.   Awaken the baby if it has been 3 to 4 hours  since the last feeding.   Frequent feeding will help the mother make more milk and will prevent problems like sore nipples and engorgement of the breasts.  BABY'S POSITION AT THE BREAST  Whether lying down or sitting, be sure that the baby's tummy is facing your tummy.   Support the breast with 4 fingers underneath the breast and the thumb above. Make sure your fingers are well away from the nipple and baby's mouth.   Stroke the baby's lips and cheek closest to the breast gently with your finger or nipple.   When the baby's mouth is open wide enough, place all of your nipple and as much of the dark area around the nipple as possible into your baby's mouth.   Pull the baby in close so the tip of the nose and the baby's cheeks touch the breast during the feeding.  FEEDINGS  The length of each feeding varies from baby to baby and from feeding to feeding.   The baby must suck about 2 to 3 minutes for your milk to get to him or her. This is called a "let down." For this reason, allow the baby to feed on each breast as long as he or she wants. Your baby will end the feeding when he or she has received the right balance of nutrients.   To break the suction, put your finger into the corner of the baby's mouth and slide it between his or her gums before removing your breast from his or her mouth. This will help prevent sore nipples.  REDUCING BREAST ENGORGEMENT  In the first week after your baby is born, you may experience signs of breast engorgement. When breasts are engorged, they feel heavy, warm, full, and may be tender to the touch. You can reduce engorgement if you:   Nurse frequently, every 2 to 3 hours. Mothers who breastfeed early and often have fewer problems with engorgement.   Place light ice packs on your breasts between feedings. This reduces swelling. Wrap the ice packs in a lightweight towel to protect your skin.   Apply moist hot packs to your breast for 5 to 10 minutes before  each feeding. This increases circulation and helps the milk flow.     Gently massage your breast before and during the feeding.   Make sure that the baby empties at least one breast at every feeding before switching sides.   Use a breast pump to empty the breasts if your baby is sleepy or not nursing well. You may also want to pump if you are returning to work or or you feel you are getting engorged.   Avoid bottle feeds, pacifiers or supplemental feedings of water or juice in place of breastfeeding.   Be sure the baby is latched on and positioned properly while breastfeeding.   Prevent fatigue, stress, and anemia.   Wear a supportive bra, avoiding underwire styles.   Eat a balanced diet with enough fluids.  If you follow these suggestions, your engorgement should improve in 24 to 48 hours. If you are still experiencing difficulty, call your lactation consultant or caregiver. IS MY BABY GETTING ENOUGH MILK? Sometimes, mothers worry about whether their babies are getting enough milk. You can be assured that your baby is getting enough milk if:  The baby is actively sucking and you hear swallowing.   The baby nurses at least 8 to 12 times in a 24 hour time period. Nurse your baby until he or she unlatches or falls asleep at the first breast (at least 10 to 20 minutes), then offer the second side.   The baby is wetting 5 to 6 disposable diapers (6 to 8 cloth diapers) in a 24 hour period by 5 to 6 days of age.   The baby is having at least 2 to 3 stools every 24 hours for the first few months. Breast milk is all the food your baby needs. It is not necessary for your baby to have water or formula. In fact, to help your breasts make more milk, it is best not to give your baby supplemental feedings during the early weeks.   The stool should be soft and yellow.   The baby should gain 4 to 7 ounces per week after he is 4 days old.  TAKE CARE OF YOURSELF Take care of your breasts by:  Bathing  or showering daily.   Avoiding the use of soaps on your nipples.   Start feedings on your left breast at one feeding and on your right breast at the next feeding.   You will notice an increase in your milk supply 2 to 5 days after delivery. You may feel some discomfort from engorgement, which makes your breasts very firm and often tender. Engorgement "peaks" out within 24 to 48 hours. In the meantime, apply warm moist towels to your breasts for 5 to 10 minutes before feeding. Gentle massage and expression of some milk before feeding will soften your breasts, making it easier for your baby to latch on. Wear a well fitting nursing bra and air dry your nipples for 10 to 15 minutes after each feeding.   Only use cotton bra pads.   Only use pure lanolin on your nipples after nursing. You do not need to wash it off before nursing.  Take care of yourself by:   Eating well-balanced meals and nutritious snacks.   Drinking milk, fruit juice, and water to satisfy your thirst (about 8 glasses a day).   Getting plenty of rest.   Increasing calcium in your diet (1200 mg a day).   Avoiding foods that you notice affect the baby in a bad way.  SEEK MEDICAL CARE IF:   You have any questions or difficulty with   breastfeeding.   You need help.   You have a hard, red, sore area on your breast, accompanied by a fever of 100.5 F (38.1 C) or more.   Your baby is too sleepy to eat well or is having trouble sleeping.   Your baby is wetting less than 6 diapers per day, by 5 days of age.   Your baby's skin or white part of his or her eyes is more yellow than it was in the hospital.   You feel depressed.  Document Released: 02/18/2005 Document Revised: 10/31/2010 Document Reviewed: 10/03/2008 [employer] Patient Information 2012 [employer]. 

## 2011-02-04 NOTE — Progress Notes (Signed)
Pulse 87. Vaginal discharge is thick, white.

## 2011-02-07 ENCOUNTER — Encounter (HOSPITAL_COMMUNITY): Payer: Self-pay | Admitting: *Deleted

## 2011-02-07 ENCOUNTER — Inpatient Hospital Stay (HOSPITAL_COMMUNITY)
Admission: AD | Admit: 2011-02-07 | Discharge: 2011-02-07 | Disposition: A | Discharge status: Home / Self Care | Payer: Medicaid Other | Source: Ambulatory Visit | Attending: Obstetrics & Gynecology | Admitting: Obstetrics & Gynecology

## 2011-02-07 DIAGNOSIS — O4700 False labor before 37 completed weeks of gestation, unspecified trimester: Secondary | ICD-10-CM | POA: Insufficient documentation

## 2011-02-07 DIAGNOSIS — N760 Acute vaginitis: Secondary | ICD-10-CM | POA: Insufficient documentation

## 2011-02-07 DIAGNOSIS — O2390 Unspecified genitourinary tract infection in pregnancy, unspecified trimester: Secondary | ICD-10-CM | POA: Insufficient documentation

## 2011-02-07 DIAGNOSIS — Z3009 Encounter for other general counseling and advice on contraception: Secondary | ICD-10-CM | POA: Diagnosis present

## 2011-02-07 DIAGNOSIS — F1290 Cannabis use, unspecified, uncomplicated: Secondary | ICD-10-CM | POA: Diagnosis present

## 2011-02-07 DIAGNOSIS — A499 Bacterial infection, unspecified: Secondary | ICD-10-CM | POA: Insufficient documentation

## 2011-02-07 DIAGNOSIS — Z22330 Carrier of Group B streptococcus: Secondary | ICD-10-CM

## 2011-02-07 DIAGNOSIS — B9689 Other specified bacterial agents as the cause of diseases classified elsewhere: Secondary | ICD-10-CM | POA: Insufficient documentation

## 2011-02-07 DIAGNOSIS — O479 False labor, unspecified: Secondary | ICD-10-CM | POA: Diagnosis present

## 2011-02-07 LAB — URINALYSIS, ROUTINE W REFLEX MICROSCOPIC
Bilirubin Urine: NEGATIVE
Nitrite: NEGATIVE
Specific Gravity, Urine: 1.02 (ref 1.005–1.030)
Urobilinogen, UA: 0.2 mg/dL (ref 0.0–1.0)
pH: 6.5 (ref 5.0–8.0)

## 2011-02-07 LAB — URINE MICROSCOPIC-ADD ON

## 2011-02-07 LAB — WET PREP, GENITAL

## 2011-02-07 MED ORDER — METRONIDAZOLE 500 MG PO TABS: 500.0000 mg | ORAL_TABLET | Freq: Two times a day (BID) | ORAL | Status: DC

## 2011-02-07 NOTE — ED Provider Notes (Signed)
Pamela Bridges is a 20 y.o. year old G8P1102 female at [redacted]w[redacted]d weeks gestation who presents to [facility] reporting preterm labor and pelvic pressure. She also reports positive fetal movement and moderate white odorless discharge and denies leaking of fluid, UTI symptoms or vaginal bleeding. Space She strongly desires bilateral tubal ligation after the birth of this child but states that she has not signed them at the health department.  History OB History    Grav Para Term Preterm Abortions TAB SAB Ect Mult Living   3 2 1 1      2      Past Medical History  Diagnosis Date  . Abnormal Pap smear   . Gonorrhea   . Trichomonas   . Syphilis   . Pyelonephritis   . Anemia   . Suicide attempt   . Urinary tract infection   . Depression     no meds currently, doing ok   Past Surgical History  Procedure Date  . Wisdom tooth extraction 2005   Family History: family history includes Learning disabilities in her daughter.  There is no history of Anesthesia problems. Social History:  reports that she has been smoking Cigarettes.  She has a 3.25 pack-year smoking history. She has never used smokeless tobacco. She reports that she uses illicit drugs (Cocaine, "Crack" cocaine, and Marijuana) about 7 times per week. She reports that she does not drink alcohol.  ROS: Otherwise negative  Dilation: 2 Effacement (%): 60 Station: -2 Exam by:: VKatrinka Blazing CNM Blood pressure 119/73, pulse 73, temperature 98.3 F (36.8 C), temperature source Oral, resp. rate 18, height 5\' 6"  (1.676 m), weight 75.297 kg (166 lb), unknown if currently breastfeeding. Maternal Exam:  Uterine Assessment: Contraction strength is moderate.  Contraction duration is 50 seconds. Contraction frequency is irregular.  Every 3-8 minutes  Abdomen: Fetal presentation: vertex  Introitus: Vagina is positive for vaginal discharge (Moderate amount of mucous and thin white, odorless discharge).  Pelvis: adequate for delivery.   Cervix:  Cervix evaluated by digital exam.     Fetal Exam Fetal Monitor Review: Mode: ultrasound.   Baseline rate: 130.  Variability: moderate (6-25 bpm).   Pattern: accelerations present and no decelerations.    Fetal State Assessment: Category I - tracings are normal.     Physical Exam  Nursing note and vitals reviewed. Constitutional: She is oriented to person, place, and time. She appears well-developed and well-nourished. She appears distressed.  Cardiovascular: Normal rate.   Respiratory: Effort normal.  GI: Soft. There is no tenderness.  Genitourinary: There is no lesion on the right labia. There is no lesion on the left labia. Uterus is not tender. No bleeding around the vagina. Vaginal discharge (Moderate amount of mucous and thin white, odorless discharge) found.  Musculoskeletal: Normal range of motion.  Neurological: She is alert and oriented to person, place, and time. She has normal reflexes.  Skin: Skin is warm and dry.  Psychiatric: She has a normal mood and affect.    Dilation: 2 Effacement (%): 60 Station: -2 Presentation: Vertex Exam by:: Ivonne Andrew CNM No change after 2 hours  Prenatal labs: ABO, Rh: --/--/O POS (06/14 1050) Antibody: Negative (08/12 0000) Rubella: Immune (08/01 0000) RPR: Nonreactive (08/01 0000)  HBsAg: Negative (08/01 0000)  HIV: Non-reactive (08/01 0000)  GBS:   positive   Assessment/Plan: Assessment: 1. Preterm UC's w/ no further cervical change 2. FHR category I 3. BV 4. Hematuria (possibly from contamination) w/ out UTI Sx. 5. Desires BTL  Plan: 1. D/C home 2. Labor precautions, FKCs 3. Rx Flagyl 4. Urine Culture sent 5. R/B/I of BTL discussed including bleeding, infection, damage to nearby organs, failure of method and regret. BTL consent signed. Pt informed that BTL may not be done immediately postpartum if less than 72 hours after signing consent, but may have BTL 6 weeks PP.  Dorathy Kinsman 02/07/2011, 9:46 AM

## 2011-02-07 NOTE — ED Notes (Signed)
Pt directly to room 10 from lobby.

## 2011-02-12 ENCOUNTER — Inpatient Hospital Stay (HOSPITAL_COMMUNITY)
Admission: AD | Admit: 2011-02-12 | Discharge: 2011-02-12 | Disposition: A | Discharge status: Home / Self Care | Payer: Medicaid Other | Source: Ambulatory Visit | Attending: Obstetrics & Gynecology | Admitting: Obstetrics & Gynecology

## 2011-02-12 ENCOUNTER — Encounter (HOSPITAL_COMMUNITY): Payer: Self-pay | Admitting: *Deleted

## 2011-02-12 DIAGNOSIS — O228X9 Other venous complications in pregnancy, unspecified trimester: Secondary | ICD-10-CM | POA: Insufficient documentation

## 2011-02-12 DIAGNOSIS — R1110 Vomiting, unspecified: Secondary | ICD-10-CM

## 2011-02-12 DIAGNOSIS — O212 Late vomiting of pregnancy: Secondary | ICD-10-CM | POA: Insufficient documentation

## 2011-02-12 DIAGNOSIS — K649 Unspecified hemorrhoids: Secondary | ICD-10-CM | POA: Insufficient documentation

## 2011-02-12 LAB — URINALYSIS, ROUTINE W REFLEX MICROSCOPIC
Bilirubin Urine: NEGATIVE
Ketones, ur: NEGATIVE mg/dL
Nitrite: NEGATIVE
Protein, ur: NEGATIVE mg/dL

## 2011-02-12 MED ORDER — PRAMOXINE HCL 1 % RE FOAM: RECTAL | Status: AC | PRN

## 2011-02-12 MED ORDER — SENNOSIDES-DOCUSATE SODIUM 8.6-50 MG PO TABS: 1.0000 | ORAL_TABLET | Freq: Every day | ORAL | Status: DC

## 2011-02-12 MED ORDER — PROMETHAZINE HCL 25 MG PO TABS: 12.5000 mg | ORAL_TABLET | Freq: Four times a day (QID) | ORAL | Status: DC | PRN

## 2011-02-12 MED ORDER — ONDANSETRON 8 MG PO TBDP
8.0000 mg | ORAL_TABLET | Freq: Once | ORAL | Status: AC
  Administered 2011-02-12: 8 mg via ORAL
  Filled 2011-02-12: qty 1

## 2011-02-12 NOTE — ED Provider Notes (Signed)
History     Chief Complaint  Patient presents with  . Emesis   HPI Comments: 14 at [redacted]w[redacted]d who presents with 1 day of vomiting. Pt states that she felt fine yesterday and she was able to eat breakfast this morning, but she then vomited everything that she ate.  She then vomited 4 more times, each time after she tried to eat something.  Denies fever, chills, cough, body aches.  No sick contacts.    Also c/o of painful defecation.  States that this has been going on for a few days.  Denies constipation.    Irregular, mild-moderate contractions. Denies loss of fluid or vaginal bleeding.  Is feeling the baby move normally.   Emesis  Pertinent negatives include no chest pain, chills, coughing, diarrhea, dizziness, fever, headaches or myalgias.    Past Medical History  Diagnosis Date  . Abnormal Pap smear   . Gonorrhea   . Trichomonas   . Syphilis   . Pyelonephritis   . Anemia   . Suicide attempt   . Urinary tract infection   . Depression     no meds currently, doing ok    Past Surgical History  Procedure Date  . Wisdom tooth extraction 2005    Family History  Problem Relation Age of Onset  . Anesthesia problems Neg Hx   . Learning disabilities Daughter     History  Substance Use Topics  . Smoking status: Current Everyday Smoker -- 0.2 packs/day for 13 years    Types: Cigarettes  . Smokeless tobacco: Never Used  . Alcohol Use: No     pt denies any other drug use other than THC     Allergies:  Allergies  Allergen Reactions  . Eggs Or Egg-Derived Products Nausea And Vomiting    No problems with vaccines.    Prescriptions prior to admission  Medication Sig Dispense Refill  . acetaminophen (TYLENOL) 500 MG chewable tablet Chew 500 mg by mouth every 6 (six) hours as needed. Patient used this medication for pain in her stomach.       . prenatal vitamin w/FE, FA (PRENATAL 1 + 1) 27-1 MG TABS Take 1 tablet by mouth daily.  30 each  2    Review of Systems    Constitutional: Negative for fever and chills.  HENT: Negative for congestion and sore throat.   Eyes: Negative for blurred vision.  Respiratory: Negative for cough.   Cardiovascular: Negative for chest pain and palpitations.  Gastrointestinal: Positive for nausea and vomiting. Negative for heartburn, diarrhea and constipation.       Irregular ctx, q10-70mins  Genitourinary: Negative for dysuria.  Musculoskeletal: Negative for myalgias.  Skin: Negative for rash.  Neurological: Negative for dizziness and headaches.  Psychiatric/Behavioral: Negative for depression.   Physical Exam   Blood pressure 120/70, pulse 93, temperature 99.1 F (37.3 C), temperature source Oral, resp. rate 18, SpO2 97.00%, unknown if currently breastfeeding.  Physical Exam  Constitutional: She appears well-developed and well-nourished.  HENT:  Head: Normocephalic and atraumatic.  Eyes: EOM are normal.  Neck: Normal range of motion. Neck supple.  Cardiovascular: Normal rate, regular rhythm and normal heart sounds.   Respiratory: Effort normal and breath sounds normal.  GI: Soft.       Gravid, size appropriate for dates  Genitourinary: Vagina normal.    There is no rash, tenderness or lesion on the right labia. There is no rash, tenderness or lesion on the left labia.    MAU Course  Procedures  MDM FHT: baseline: 150, moderate variability, no accelerations, no declerations  Assessment and Plan  A: 1 day of vomiting -UA with spec grav of 1.020, trace leucocytes, neg nitrates, no ketones -hemorrhoid on exam  P: zofran -pt currently w/o insurance, so will write for phenergan -stool softener, proctofoam -discussed signs of labor -encouraged pt to quit smoking  Discussed with Philipp Deputy, CNM [place], [place] 02/12/2011, 3:08 PM

## 2011-02-12 NOTE — Progress Notes (Signed)
States has been vomiting all day. Denies fever, chills, body aches. States she has pain with vomiting and pain when she has a bowel movement. Otherwise, no pain. Hx PT contractions. Prenatal care in [facility].

## 2011-02-18 ENCOUNTER — Ambulatory Visit (INDEPENDENT_AMBULATORY_CARE_PROVIDER_SITE_OTHER): Payer: Self-pay | Admitting: Obstetrics and Gynecology

## 2011-02-18 DIAGNOSIS — O09219 Supervision of pregnancy with history of pre-term labor, unspecified trimester: Secondary | ICD-10-CM

## 2011-02-18 DIAGNOSIS — O0990 Supervision of high risk pregnancy, unspecified, unspecified trimester: Secondary | ICD-10-CM

## 2011-02-18 DIAGNOSIS — O280 Abnormal hematological finding on antenatal screening of mother: Secondary | ICD-10-CM

## 2011-02-18 DIAGNOSIS — O289 Unspecified abnormal findings on antenatal screening of mother: Secondary | ICD-10-CM

## 2011-02-18 DIAGNOSIS — Z22330 Carrier of Group B streptococcus: Secondary | ICD-10-CM

## 2011-02-18 LAB — POCT URINALYSIS DIP (DEVICE)
Bilirubin Urine: NEGATIVE
Glucose, UA: NEGATIVE mg/dL
Hgb urine dipstick: NEGATIVE
Ketones, ur: NEGATIVE mg/dL
pH: 7 (ref 5.0–8.0)

## 2011-02-18 NOTE — Progress Notes (Signed)
Some swelling in ankles, feels pain in her legs.

## 2011-02-18 NOTE — Progress Notes (Signed)
Patient doing well without complaints. FM/labor precautions reviewed. Patient not having any contractions and declined pelvic exam today. Patient still smoking 3 cig/day and not interested in quitting at this time

## 2011-02-18 NOTE — Patient Instructions (Addendum)
Normal Labor and Delivery Your caregiver must first be sure you are in labor. Signs of labor include:  You may pass what is called "the mucus plug" before labor begins. This is a small amount of blood stained mucus.   Regular uterine contractions.   The time between contractions get closer together.   The discomfort and pain gradually gets more intense.   Pains are mostly located in the back.   Pains get worse when walking.   The cervix (the opening of the uterus becomes thinner (begins to efface) and opens up (dilates).  Once you are in labor and admitted into the hospital or care center, your caregiver will do the following:  A complete physical examination.   Check your vital signs (blood pressure, pulse, temperature and the fetal heart rate).   Do a vaginal examination (using a sterile glove and lubricant) to determine:   The position (presentation) of the baby (head [vertex] or buttock first).   The level (station) of the baby's head in the birth canal.   The effacement and dilatation of the cervix.   You may have your pubic hair shaved and be given an enema depending on your caregiver and the circumstance.   An electronic monitor is usually placed on your abdomen. The monitor follows the length and intensity of the contractions, as well as the baby's heart rate.   Usually, your caregiver will insert an IV in your arm with a bottle of sugar water. This is done as a precaution so that medications can be given to you quickly during labor or delivery.  NORMAL LABOR AND DELIVERY IS DIVIDED UP INTO 3 STAGES: First Stage This is when regular contractions begin and the cervix begins to efface and dilate. This stage can last from 3 to 15 hours. The end of the first stage is when the cervix is 100% effaced and 10 centimeters dilated. Pain medications may be given by   Injection (morphine, demerol, etc.)   Regional anesthesia (spinal, caudal or epidural, anesthetics given in  different locations of the spine). Paracervical pain medication may be given, which is an injection of and anesthetic on each side of the cervix.  A pregnant woman may request to have "Natural Childbirth" which is not to have any medications or anesthesia during her labor and delivery. Second Stage This is when the baby comes down through the birth canal (vagina) and is born. This can take 1 to 4 hours. As the baby's head comes down through the birth canal, you may feel like you are going to have a bowel movement. You will get the urge to bear down and push until the baby is delivered. As the baby's head is being delivered, the caregiver will decide if an episiotomy (a cut in the perineum and vagina area) is needed to prevent tearing of the tissue in this area. The episiotomy is sewn up after the delivery of the baby and placenta. Sometimes a mask with nitrous oxide is given for the mother to breath during the delivery of the baby to help if there is too much pain. The end of Stage 2 is when the baby is fully delivered. Then when the umbilical cord stops pulsating it is clamped and cut. Third Stage The third stage begins after the baby is completely delivered and ends after the placenta (afterbirth) is delivered. This usually takes 5 to 30 minutes. After the placenta is delivered, a medication is given either by intravenous or injection to help contract   the uterus and prevent bleeding. The third stage is not painful and pain medication is usually not necessary. If an episiotomy was done, it is repaired at this time. After the delivery, the mother is watched and monitored closely for 1 to 2 hours to make sure there is no postpartum bleeding (hemorrhage). If there is a lot of bleeding, medication is given to contract the uterus and stop the bleeding. Document Released: 11/28/2007 Document Revised: 10/31/2010 Document Reviewed: 11/28/2007 [facility] Patient Information 2012 [place], [place].   Smoking  Cessation  This document explains the best ways for you to quit smoking and new treatments to help. It lists new medicines that can double or triple your chances of quitting and quitting for good. It also considers ways to avoid relapses and concerns you may have about quitting, including weight gain. NICOTINE: A POWERFUL ADDICTION If you have tried to quit smoking, you know how hard it can be. It is hard because nicotine is a very addictive drug. For some people, it can be as addictive as heroin or cocaine. Usually, people make 2 or 3 tries, or more, before finally being able to quit. Each time you try to quit, you can learn about what helps and what hurts. Quitting takes hard work and a lot of effort, but you can quit smoking. QUITTING SMOKING IS ONE OF THE MOST IMPORTANT THINGS YOU WILL EVER DO.  You will live longer, feel better, and live better.   The impact on your body of quitting smoking is felt almost immediately:   Within 20 minutes, blood pressure decreases. Pulse returns to its normal level.   After 8 hours, carbon monoxide levels in the blood return to normal. Oxygen level increases.   After 24 hours, chance of heart attack starts to decrease. Breath, hair, and body stop smelling like smoke.   After 48 hours, damaged nerve endings begin to recover. Sense of taste and smell improve.   After 72 hours, the body is virtually free of nicotine. Bronchial tubes relax and breathing becomes easier.   After 2 to 12 weeks, lungs can hold more air. Exercise becomes easier and circulation improves.   Quitting will reduce your risk of having a heart attack, stroke, cancer, or lung disease:   After 1 year, the risk of coronary heart disease is cut in half.   After 5 years, the risk of stroke falls to the same as a nonsmoker.   After 10 years, the risk of lung cancer is cut in half and the risk of other cancers decreases significantly.   After 15 years, the risk of coronary heart disease  drops, usually to the level of a nonsmoker.   If you are pregnant, quitting smoking will improve your chances of having a healthy baby.   The people you live with, especially your children, will be healthier.   You will have extra money to spend on things other than cigarettes.  FIVE KEYS TO QUITTING Studies have shown that these 5 steps will help you quit smoking and quit for good. You have the best chances of quitting if you use them together: 1. Get ready.  2. Get support and encouragement.  3. Learn new skills and behaviors.  4. Get medicine to reduce your nicotine addiction and use it correctly.  5. Be prepared for relapse or difficult situations. Be determined to continue trying to quit, even if you do not succeed at first.  1. GET READY  Set a quit date.   Change  your environment.   Get rid of ALL cigarettes, ashtrays, matches, and lighters in your home, car, and place of work.   Do not let people smoke in your home.   Review your past attempts to quit. Think about what worked and what did not.   Once you quit, do not smoke. NOT EVEN A PUFF!  2. GET SUPPORT AND ENCOURAGEMENT Studies have shown that you have a better chance of being successful if you have help. You can get support in many ways.  Tell your family, friends, and coworkers that you are going to quit and need their support. Ask them not to smoke around you.   Talk to your caregivers (doctor, dentist, nurse, pharmacist, psychologist, and/or smoking counselor).   Get individual, group, or telephone counseling and support. The more counseling you have, the better your chances are of quitting. Programs are available at [employer] and health centers. Call your local health department for information about programs in your area.   Spiritual beliefs and practices may help some smokers quit.   Quit meters are Photographer that keep track of quit statistics, such as amount of  "quit-time," cigarettes not smoked, and money saved.   Many smokers find one or more of the many self-help books available useful in helping them quit and stay off tobacco.  3. LEARN NEW SKILLS AND BEHAVIORS  Try to distract yourself from urges to smoke. Talk to someone, go for a walk, or occupy your time with a task.   When you first try to quit, change your routine. Take a different route to work. Drink tea instead of coffee. Eat breakfast in a different place.   Do something to reduce your stress. Take a hot bath, exercise, or read a book.   Plan something enjoyable to do every day. Reward yourself for not smoking.   Explore interactive web-based programs that specialize in helping you quit.  4. GET MEDICINE AND USE IT CORRECTLY Medicines can help you stop smoking and decrease the urge to smoke. Combining medicine with the above behavioral methods and support can quadruple your chances of successfully quitting smoking. The U.[place]. Food and Drug Administration (FDA) has approved 7 medicines to help you quit smoking. These medicines fall into 3 categories.  Nicotine replacement therapy (delivers nicotine to your body without the negative effects and risks of smoking):   Nicotine gum: Available over-the-counter.   Nicotine lozenges: Available over-the-counter.   Nicotine inhaler: Available by prescription.   Nicotine nasal spray: Available by prescription.   Nicotine skin patches (transdermal): Available by prescription and over-the-counter.   Antidepressant medicine (helps people abstain from smoking, but how this works is unknown):   Bupropion sustained-release (SR) tablets: Available by prescription.   Nicotinic receptor partial agonist (simulates the effect of nicotine in your brain):   Varenicline tartrate tablets: Available by prescription.   Ask your caregiver for advice about which medicines to use and how to use them. Carefully read the information on the package.    Everyone who is trying to quit may benefit from using a medicine. If you are pregnant or trying to become pregnant, nursing an infant, you are under age 11, or you smoke fewer than 10 cigarettes per day, talk to your caregiver before taking any nicotine replacement medicines.   You should stop using a nicotine replacement product and call your caregiver if you experience nausea, dizziness, weakness, vomiting, fast or irregular heartbeat, mouth problems with the lozenge or gum,  or redness or swelling of the skin around the patch that does not go away.   Do not use any other product containing nicotine while using a nicotine replacement product.   Talk to your caregiver before using these products if you have diabetes, heart disease, asthma, stomach ulcers, you had a recent heart attack, you have high blood pressure that is not controlled with medicine, a history of irregular heartbeat, or you have been prescribed medicine to help you quit smoking.  5. BE PREPARED FOR RELAPSE OR DIFFICULT SITUATIONS  Most relapses occur within the first 3 months after quitting. Do not be discouraged if you start smoking again. Remember, most people try several times before they finally quit.   You may have symptoms of withdrawal because your body is used to nicotine. You may crave cigarettes, be irritable, feel very hungry, cough often, get headaches, or have difficulty concentrating.   The withdrawal symptoms are only temporary. They are strongest when you first quit, but they will go away within 10 to 14 days.  Here are some difficult situations to watch for:  Alcohol. Avoid drinking alcohol. Drinking lowers your chances of successfully quitting.   Caffeine. Try to reduce the amount of caffeine you consume. It also lowers your chances of successfully quitting.   Other smokers. Being around smoking can make you want to smoke. Avoid smokers.   Weight gain. Many smokers will gain weight when they quit, usually  less than 10 pounds. Eat a healthy diet and stay active. Do not let weight gain distract you from your main goal, quitting smoking. Some medicines that help you quit smoking may also help delay weight gain. You can always lose the weight gained after you quit.   Bad mood or depression. There are a lot of ways to improve your mood other than smoking.  If you are having problems with any of these situations, talk to your caregiver. SPECIAL SITUATIONS AND CONDITIONS Studies suggest that everyone can quit smoking. Your situation or condition can give you a special reason to quit.  Pregnant women/new mothers: By quitting, you protect your baby's health and your own.   Hospitalized patients: By quitting, you reduce health problems and help healing.   Heart attack patients: By quitting, you reduce your risk of a second heart attack.   Lung, head, and neck cancer patients: By quitting, you reduce your chance of a second cancer.   Parents of children and adolescents: By quitting, you protect your children from illnesses caused by secondhand smoke.  QUESTIONS TO THINK ABOUT Think about the following questions before you try to stop smoking. You may want to talk about your answers with your caregiver.  Why do you want to quit?   If you tried to quit in the past, what helped and what did not?   What will be the most difficult situations for you after you quit? How will you plan to handle them?   Who can help you through the tough times? Your family? Friends? Caregiver?   What pleasures do you get from smoking? What ways can you still get pleasure if you quit?  Here are some questions to ask your caregiver:  How can you help me to be successful at quitting?   What medicine do you think would be best for me and how should I take it?   What should I do if I need more help?   What is smoking withdrawal like? How can I get information on withdrawal?  Quitting takes hard work and a lot of effort,  but you can quit smoking. FOR MORE INFORMATION  Smokefree.gov (http://www.davis-sullivan.com/) provides free, accurate, evidence-based information and professional assistance to help support the immediate and long-term needs of people trying to quit smoking. Document Released: 02/12/2001 Document Revised: 10/31/2010 Document Reviewed: 12/05/2008 [facility] Patient Information 2012 [place], [place].

## 2011-02-19 LAB — GC/CHLAMYDIA PROBE AMP, URINE: GC Probe Amp, Urine: NEGATIVE

## 2011-02-26 ENCOUNTER — Inpatient Hospital Stay (HOSPITAL_COMMUNITY)
Admission: AD | Admit: 2011-02-26 | Discharge: 2011-02-26 | Disposition: A | Discharge status: Home / Self Care | Payer: Medicaid Other | Source: Ambulatory Visit | Attending: Family Medicine | Admitting: Family Medicine

## 2011-02-26 ENCOUNTER — Encounter (HOSPITAL_COMMUNITY): Payer: Self-pay | Admitting: *Deleted

## 2011-02-26 DIAGNOSIS — O479 False labor, unspecified: Secondary | ICD-10-CM | POA: Insufficient documentation

## 2011-02-26 DIAGNOSIS — O99330 Smoking (tobacco) complicating pregnancy, unspecified trimester: Secondary | ICD-10-CM

## 2011-02-26 DIAGNOSIS — Z22330 Carrier of Group B streptococcus: Secondary | ICD-10-CM

## 2011-02-26 DIAGNOSIS — Z3009 Encounter for other general counseling and advice on contraception: Secondary | ICD-10-CM

## 2011-02-26 NOTE — ED Provider Notes (Signed)
History     Chief Complaint  Patient presents with  . Labor Eval   HPI Pamela Bridges 20 y.o. female  [phone] at [redacted]w[redacted]d presenting with contractions.   Patient states since 9am this morning she began having contractions every 5 minutes. She says about once every 30 minutes she could not talk through them. In the last hour, her contractions have eased in pain and she can talk through all of them and even does not feel some that are picked up on the tocometer.   Patient denies decreased fetal movement/discharge/blood from vagina/rush of fluid.     OB History    Grav Para Term Preterm Abortions TAB SAB Ect Mult Living   3 2 1 1      2       Past Medical History  Diagnosis Date  . Abnormal Pap smear   . Gonorrhea   . Trichomonas   . Syphilis   . Pyelonephritis   . Anemia   . Suicide attempt   . Urinary tract infection   . Depression     no meds currently, doing ok    Past Surgical History  Procedure Date  . Wisdom tooth extraction 2005    Family History  Problem Relation Age of Onset  . Anesthesia problems Neg Hx   . Learning disabilities Daughter     History  Substance Use Topics  . Smoking status: Current Everyday Smoker -- 0.2 packs/day for 13 years    Types: Cigarettes  . Smokeless tobacco: Never Used  . Alcohol Use: No     pt denies any other drug use other than THC     Allergies:  Allergies  Allergen Reactions  . Eggs Or Egg-Derived Products Nausea And Vomiting    No problems with vaccines.    Prescriptions prior to admission  Medication Sig Dispense Refill  . acetaminophen (TYLENOL) 500 MG tablet Take 1,000 mg by mouth every 6 (six) hours as needed. For pain       . prenatal vitamin w/FE, FA (PRENATAL 1 + 1) 27-1 MG TABS Take 1 tablet by mouth daily.  30 each  2    ROS negative except as noted in HPI   Physical Exam   Blood pressure 118/68, pulse 73, temperature 98.8 F (37.1 C), resp. rate 18, height 5\' 6"  (1.676 m), weight 82.101 kg  (181 lb).  Physical Exam  Vitals reviewed. Constitutional: She is oriented to person, place, and time. She appears well-developed and well-nourished. No distress.  Cardiovascular: Normal rate and regular rhythm.  Exam reveals no gallop and no friction rub.   No murmur heard. Respiratory: Breath sounds normal. No respiratory distress. She has no wheezes. She has no rales.  GI:       Gravid. Size consistent with term.    Genitourinary: Vagina normal. No vaginal discharge found.  Musculoskeletal: Normal range of motion. She exhibits no edema.  Neurological: She is alert and oriented to person, place, and time.   Dilation: 3 Effacement (%): 80 Station: Ballotable Presentation: Vertex Dr. Durene Cal  FHT-140 baseline. Accels present x2/reactive. No decels. Moderate variability.  Contractions every 5-7 minutes  MAU Course  Procedures  MDM Early labor. Contractions more comfortable now.   Assessment and Plan  #1 [id number] at [redacted]w[redacted]d #2 Early labor.   Discharge home with labor precautions and kick counts. Told patient to come back if contractions increase in intensity to where she cannot talk through most of them, water breaking, decreased FM, or bleeding  like a period.   Discussed with Wynelle Bourgeois, CNM  HUNTER, STEPHEN 02/26/2011, 9:29 PM   Agree with assessment and plan. Wynelle Bourgeois CNM

## 2011-02-26 NOTE — Progress Notes (Signed)
"  a lot of discomfort in my lower abd" all day today. Denies bleeding or ROM. Denies problems with pregnancy.

## 2011-02-26 NOTE — Progress Notes (Signed)
Pt states pain started this morning about 0900

## 2011-02-27 ENCOUNTER — Encounter (HOSPITAL_COMMUNITY): Payer: Self-pay | Admitting: *Deleted

## 2011-02-27 ENCOUNTER — Encounter (HOSPITAL_COMMUNITY): Payer: Self-pay

## 2011-02-27 ENCOUNTER — Encounter (HOSPITAL_COMMUNITY): Payer: Self-pay | Admitting: Anesthesiology

## 2011-02-27 ENCOUNTER — Inpatient Hospital Stay (HOSPITAL_COMMUNITY): Payer: Medicaid Other | Admitting: Anesthesiology

## 2011-02-27 ENCOUNTER — Inpatient Hospital Stay (HOSPITAL_COMMUNITY)
Admission: AD | Admit: 2011-02-27 | Discharge: 2011-03-02 | DRG: 767 | Disposition: A | Discharge status: Home / Self Care | Payer: Medicaid Other | Source: Ambulatory Visit | Attending: Obstetrics & Gynecology | Admitting: Obstetrics & Gynecology

## 2011-02-27 DIAGNOSIS — Z3009 Encounter for other general counseling and advice on contraception: Secondary | ICD-10-CM

## 2011-02-27 DIAGNOSIS — O09899 Supervision of other high risk pregnancies, unspecified trimester: Secondary | ICD-10-CM

## 2011-02-27 DIAGNOSIS — O9989 Other specified diseases and conditions complicating pregnancy, childbirth and the puerperium: Secondary | ICD-10-CM

## 2011-02-27 DIAGNOSIS — Z302 Encounter for sterilization: Secondary | ICD-10-CM

## 2011-02-27 DIAGNOSIS — O4700 False labor before 37 completed weeks of gestation, unspecified trimester: Secondary | ICD-10-CM

## 2011-02-27 DIAGNOSIS — O99330 Smoking (tobacco) complicating pregnancy, unspecified trimester: Secondary | ICD-10-CM

## 2011-02-27 DIAGNOSIS — O0940 Supervision of pregnancy with grand multiparity, unspecified trimester: Secondary | ICD-10-CM

## 2011-02-27 DIAGNOSIS — Z22330 Carrier of Group B streptococcus: Secondary | ICD-10-CM

## 2011-02-27 DIAGNOSIS — O99892 Other specified diseases and conditions complicating childbirth: Principal | ICD-10-CM | POA: Diagnosis present

## 2011-02-27 DIAGNOSIS — O479 False labor, unspecified: Secondary | ICD-10-CM

## 2011-02-27 LAB — CBC
MCHC: 33.2 g/dL (ref 30.0–36.0)
MCV: 88.4 fL (ref 78.0–100.0)
Platelets: 256 10*3/uL (ref 150–400)
RDW: 13.6 % (ref 11.5–15.5)
WBC: 12.3 10*3/uL — ABNORMAL HIGH (ref 4.0–10.5)

## 2011-02-27 MED ORDER — LACTATED RINGERS IV SOLN: 500.0000 mL | INTRAVENOUS | Status: DC | PRN

## 2011-02-27 MED ORDER — LIDOCAINE HCL (PF) 1 % IJ SOLN
30.0000 mL | INTRAMUSCULAR | Status: DC | PRN
Start: 2011-02-27 — End: 2011-02-28
  Filled 2011-02-27: qty 30

## 2011-02-27 MED ORDER — OXYTOCIN 20 UNITS IN LACTATED RINGERS INFUSION - SIMPLE: 125.0000 mL/h | Freq: Once | INTRAVENOUS | Status: DC

## 2011-02-27 MED ORDER — SODIUM CHLORIDE 0.9 % IV SOLN
2.0000 g | Freq: Once | INTRAVENOUS | Status: AC
  Administered 2011-02-27: 2 g via INTRAVENOUS
  Filled 2011-02-27: qty 2000

## 2011-02-27 MED ORDER — ACETAMINOPHEN 325 MG PO TABS: 650.0000 mg | ORAL_TABLET | ORAL | Status: DC | PRN

## 2011-02-27 MED ORDER — LACTATED RINGERS IV SOLN: 500.0000 mL | Freq: Once | INTRAVENOUS | Status: DC

## 2011-02-27 MED ORDER — FLEET ENEMA 7-19 GM/118ML RE ENEM: 1.0000 | ENEMA | RECTAL | Status: DC | PRN

## 2011-02-27 MED ORDER — EPHEDRINE 5 MG/ML INJ
10.0000 mg | INTRAVENOUS | Status: DC | PRN
  Filled 2011-02-27: qty 4

## 2011-02-27 MED ORDER — FENTANYL 2.5 MCG/ML BUPIVACAINE 1/10 % EPIDURAL INFUSION (WH - ANES)
14.0000 mL/h | INTRAMUSCULAR | Status: DC
  Filled 2011-02-27: qty 60

## 2011-02-27 MED ORDER — DIPHENHYDRAMINE HCL 50 MG/ML IJ SOLN: 12.5000 mg | INTRAMUSCULAR | Status: DC | PRN

## 2011-02-27 MED ORDER — ONDANSETRON HCL 4 MG/2ML IJ SOLN: 4.0000 mg | Freq: Four times a day (QID) | INTRAMUSCULAR | Status: DC | PRN

## 2011-02-27 MED ORDER — LACTATED RINGERS IV SOLN
INTRAVENOUS | Status: DC
  Administered 2011-02-27: 19:00:00 via INTRAVENOUS

## 2011-02-27 MED ORDER — IBUPROFEN 600 MG PO TABS: 600.0000 mg | ORAL_TABLET | Freq: Four times a day (QID) | ORAL | Status: DC | PRN

## 2011-02-27 MED ORDER — OXYCODONE-ACETAMINOPHEN 5-325 MG PO TABS: 2.0000 | ORAL_TABLET | ORAL | Status: DC | PRN

## 2011-02-27 MED ORDER — OXYTOCIN BOLUS FROM INFUSION
500.0000 mL | Freq: Once | INTRAVENOUS | Status: DC
  Filled 2011-02-27: qty 500
  Filled 2011-02-27: qty 1000

## 2011-02-27 MED ORDER — PHENYLEPHRINE 40 MCG/ML (10ML) SYRINGE FOR IV PUSH (FOR BLOOD PRESSURE SUPPORT)
80.0000 ug | PREFILLED_SYRINGE | INTRAVENOUS | Status: DC | PRN
  Filled 2011-02-27: qty 5

## 2011-02-27 MED ORDER — PHENYLEPHRINE 40 MCG/ML (10ML) SYRINGE FOR IV PUSH (FOR BLOOD PRESSURE SUPPORT): 80.0000 ug | PREFILLED_SYRINGE | INTRAVENOUS | Status: DC | PRN

## 2011-02-27 MED ORDER — FENTANYL 2.5 MCG/ML BUPIVACAINE 1/10 % EPIDURAL INFUSION (WH - ANES)
INTRAMUSCULAR | Status: DC | PRN
  Administered 2011-02-27: 14 mL/h via EPIDURAL

## 2011-02-27 MED ORDER — EPHEDRINE 5 MG/ML INJ
10.0000 mg | INTRAVENOUS | Status: DC | PRN
Start: 2011-02-27 — End: 2011-02-28

## 2011-02-27 MED ORDER — CITRIC ACID-SODIUM CITRATE 334-500 MG/5ML PO SOLN: 30.0000 mL | ORAL | Status: DC | PRN

## 2011-02-27 NOTE — Anesthesia Preprocedure Evaluation (Addendum)
Anesthesia Evaluation  Patient identified by MRN, date of birth, ID band Patient awake    Reviewed: Allergy & Precautions, H&P , Patient's Chart, lab work & pertinent test results  Airway Mallampati: I TM Distance: >3 FB Neck ROM: full    Dental No notable dental hx. (+) Teeth Intact   Pulmonary neg pulmonary ROS, Current Smoker,  clear to auscultation  Pulmonary exam normal       Cardiovascular neg cardio ROS regular Normal    Neuro/Psych PSYCHIATRIC DISORDERS Negative Neurological ROS     GI/Hepatic negative GI ROS, Neg liver ROS,   Endo/Other  Negative Endocrine ROS  Renal/GU negative Renal ROSHx/o Pyelonepritis  Genitourinary negative   Musculoskeletal   Abdominal Normal abdominal exam  (+)   Peds  Hematology negative hematology ROS (+)   Anesthesia Other Findings Marijuana abuse. Dextroscoliosis lumbar spine   Reproductive/Obstetrics (+) Pregnancy                          Anesthesia Physical Anesthesia Plan  ASA: II  Anesthesia Plan: Epidural   Post-op Pain Management:    Induction:   Airway Management Planned:   Additional Equipment:   Intra-op Plan:   Post-operative Plan:   Informed Consent: I have reviewed the patients History and Physical, chart, labs and discussed the procedure including the risks, benefits and alternatives for the proposed anesthesia with the patient or authorized representative who has indicated his/her understanding and acceptance.     Plan Discussed with: Anesthesiologist and Surgeon  Anesthesia Plan Comments: (Will leave epidural in situ for PPBTL)       Anesthesia Quick Evaluation

## 2011-02-27 NOTE — H&P (Signed)
Chief Complaint:  Labor  Pamela Bridges is  20 y.o. [id number].  No LMP recorded. Patient is pregnant..  [redacted]w[redacted]d   .  Obstetrical/Gynecological History: OB History    Grav Para Term Preterm Abortions TAB SAB Ect Mult Living   3 2 1 1      2       Past Medical History: Past Medical History  Diagnosis Date  . Abnormal Pap smear   . Gonorrhea   . Trichomonas   . Syphilis   . Pyelonephritis   . Anemia   . Suicide attempt   . Urinary tract infection   . Depression     no meds currently, doing ok    Past Surgical History: Past Surgical History  Procedure Date  . Wisdom tooth extraction 2005    Family History: Family History  Problem Relation Age of Onset  . Anesthesia problems Neg Hx   . Learning disabilities Daughter     Social History: History  Substance Use Topics  . Smoking status: Current Everyday Smoker -- 0.2 packs/day for 13 years    Types: Cigarettes  . Smokeless tobacco: Never Used  . Alcohol Use: No     pt denies any other drug use other than THC     Allergies:  Allergies  Allergen Reactions  . Eggs Or Egg-Derived Products Nausea And Vomiting    No problems with vaccines.    Prescriptions prior to admission  Medication Sig Dispense Refill  . acetaminophen (TYLENOL) 500 MG tablet Take 1,000 mg by mouth every 6 (six) hours as needed. For pain       . prenatal vitamin w/FE, FA (PRENATAL 1 + 1) 27-1 MG TABS Take 1 tablet by mouth daily.  30 each  2    Review of Systems - Negative x10   Physical Exam   Blood pressure 138/76, pulse 88, temperature 98.7 F (37.1 C), temperature source Oral, resp. rate 20, SpO2 99.00%.  General: General appearance - alert, well appearing, and in no distress, oriented to person, place, and time and uncomfortable Mental status - alert, oriented to person, place, and time, normal mood, behavior, speech, dress, motor activity, and thought processes Nose - normal and patent, no erythema, discharge or polyps Mouth -  mucous membranes moist, pharynx normal without lesions Neck - supple, no significant adenopathy Chest - clear to auscultation, no wheezes, rales or rhonchi, symmetric air entry Heart - normal rate, regular rhythm, normal S1, S2, no murmurs, rubs, clicks or gallops Abdomen - gravid, contracting Neurological - alert, oriented, normal speech, no focal findings or movement disorder noted, screening mental status exam normal Musculoskeletal - no joint tenderness, deformity or swelling Extremities - peripheral pulses normal, no pedal edema, no clubbing or cyanosis Focused Gynecological Exam: 6-7/80/vtx/-1  Labs: GBS pos, O pos, Rubella imm, HsAg B neg, HIV neg   Assessment: Active Labor Patient Active Problem List  Diagnoses  . Abnormal MSAFP (maternal serum alpha-fetoprotein), elevated  . Supervision of high-risk pregnancy  . Hx of preterm delivery, currently pregnant  . Tobacco smoking complicating pregnancy  . Preterm contractions  . Carrier of group B Streptococcus  . Marijuana smoker  . Consultation for female sterilization    Plan: Admit to BS Routine orders Ampicillin for GBS  Epidural ordered  Jaylan Duggar E. 02/27/2011,7:02 PM

## 2011-02-27 NOTE — Progress Notes (Signed)
Suzanna Shores CNM at bedside for SVE & bedside ultrasound, vertex presentation.

## 2011-02-27 NOTE — Progress Notes (Signed)
MEGHEN AKOPYAN is a 20 y.o. Z6X0960 at [redacted]w[redacted]d  admitted for active labor  Subjective: More comfortable with epidural. S/p ampicillin.   Objective: BP 87/72  Pulse 74  Temp(Src) 97.8 F (36.6 C) (Oral)  Resp 18  SpO2 99%      FHT:  FHR: 140 bpm, variability: moderate,  accelerations:  Abscent,  decelerations:  Present 2 variables since AROM UC:   regular, every 2-3 minutes SVE:   Dilation: 7 Effacement (%): 90 Station: -1 Exam by:: Tamsen Reist,MD  Labs: Lab Results  Component Value Date   WBC 12.3* 02/27/2011   HGB 11.4* 02/27/2011   HCT 34.3* 02/27/2011   MCV 88.4 02/27/2011   PLT 256 02/27/2011    Assessment / Plan: Spontaneous labor, progressing normally  Labor: Progressing normally Fetal Wellbeing:  Category II Pain Control:  Epidural I/D:  s/p ampicillin Anticipated MOD:  NSVD  Maelani Yarbro 02/27/2011, 9:07 PM

## 2011-02-27 NOTE — Anesthesia Procedure Notes (Signed)
Epidural Patient location during procedure: OB Start time: 02/27/2011 8:28 PM  Staffing Anesthesiologist: Aspen Deterding A. Performed by: anesthesiologist   Preanesthetic Checklist Completed: patient identified, site marked, surgical consent, pre-op evaluation, timeout performed, IV checked, risks and benefits discussed and monitors and equipment checked  Epidural Patient position: sitting Prep: site prepped and draped and DuraPrep Patient monitoring: continuous pulse ox and blood pressure Approach: midline Injection technique: LOR air  Needle:  Needle type: Tuohy  Needle gauge: 17 G Needle length: 9 cm Needle insertion depth: 6 cm Catheter type: closed end flexible Catheter size: 19 Gauge Catheter at skin depth: 11 cm Test dose: negative and 1.5% lidocaine  Assessment Events: blood not aspirated, injection not painful, no injection resistance, negative IV test and no paresthesia  Additional Notes Patient is more comfortable after epidural dosed. Please see RN's note for documentation of vital signs and FHR which are stable. Will leave epidural in situ for post partum bilateral tubal ligation

## 2011-02-27 NOTE — ED Provider Notes (Signed)
Chart reviewed and agree with management and plan.  

## 2011-02-28 LAB — MRSA PCR SCREENING: MRSA by PCR: NEGATIVE

## 2011-02-28 MED ORDER — LANOLIN HYDROUS EX OINT: TOPICAL_OINTMENT | CUTANEOUS | Status: DC | PRN

## 2011-02-28 MED ORDER — FAMOTIDINE 20 MG PO TABS
40.0000 mg | ORAL_TABLET | Freq: Once | ORAL | Status: AC
  Administered 2011-03-01: 40 mg via ORAL
  Filled 2011-02-28: qty 2

## 2011-02-28 MED ORDER — LACTATED RINGERS IV SOLN: INTRAVENOUS | Status: DC

## 2011-02-28 MED ORDER — PRENATAL MULTIVITAMIN CH
1.0000 | ORAL_TABLET | Freq: Every day | ORAL | Status: DC
  Administered 2011-02-28: 1 via ORAL
  Filled 2011-02-28 (×2): qty 1

## 2011-02-28 MED ORDER — ZOLPIDEM TARTRATE 5 MG PO TABS: 5.0000 mg | ORAL_TABLET | Freq: Every evening | ORAL | Status: DC | PRN

## 2011-02-28 MED ORDER — DIBUCAINE 1 % RE OINT: 1.0000 "application " | TOPICAL_OINTMENT | RECTAL | Status: DC | PRN

## 2011-02-28 MED ORDER — WITCH HAZEL-GLYCERIN EX PADS: 1.0000 "application " | MEDICATED_PAD | CUTANEOUS | Status: DC | PRN

## 2011-02-28 MED ORDER — TETANUS-DIPHTH-ACELL PERTUSSIS 5-2.5-18.5 LF-MCG/0.5 IM SUSP
0.5000 mL | Freq: Once | INTRAMUSCULAR | Status: AC
  Administered 2011-02-28: 0.5 mL via INTRAMUSCULAR
  Filled 2011-02-28: qty 0.5

## 2011-02-28 MED ORDER — OXYCODONE-ACETAMINOPHEN 5-325 MG PO TABS
1.0000 | ORAL_TABLET | ORAL | Status: DC | PRN
  Administered 2011-02-28: 1 via ORAL
  Administered 2011-03-01: 2 via ORAL
  Administered 2011-03-02: 1 via ORAL
  Filled 2011-02-28 (×2): qty 1
  Filled 2011-02-28: qty 2

## 2011-02-28 MED ORDER — IBUPROFEN 600 MG PO TABS
600.0000 mg | ORAL_TABLET | Freq: Four times a day (QID) | ORAL | Status: DC
  Administered 2011-02-28 – 2011-03-02 (×11): 600 mg via ORAL
  Filled 2011-02-28 (×11): qty 1

## 2011-02-28 MED ORDER — SENNOSIDES-DOCUSATE SODIUM 8.6-50 MG PO TABS
2.0000 | ORAL_TABLET | Freq: Every day | ORAL | Status: DC
  Administered 2011-02-28 – 2011-03-02 (×2): 2 via ORAL

## 2011-02-28 MED ORDER — ONDANSETRON HCL 4 MG/2ML IJ SOLN: 4.0000 mg | INTRAMUSCULAR | Status: DC | PRN

## 2011-02-28 MED ORDER — BENZOCAINE-MENTHOL 20-0.5 % EX AERO: 1.0000 "application " | INHALATION_SPRAY | CUTANEOUS | Status: DC | PRN

## 2011-02-28 MED ORDER — SIMETHICONE 80 MG PO CHEW: 80.0000 mg | CHEWABLE_TABLET | ORAL | Status: DC | PRN

## 2011-02-28 MED ORDER — ONDANSETRON HCL 4 MG PO TABS: 4.0000 mg | ORAL_TABLET | ORAL | Status: DC | PRN

## 2011-02-28 MED ORDER — DIPHENHYDRAMINE HCL 25 MG PO CAPS: 25.0000 mg | ORAL_CAPSULE | Freq: Four times a day (QID) | ORAL | Status: DC | PRN

## 2011-02-28 MED ORDER — METOCLOPRAMIDE HCL 10 MG PO TABS
10.0000 mg | ORAL_TABLET | Freq: Once | ORAL | Status: AC
  Administered 2011-03-01: 10 mg via ORAL
  Filled 2011-02-28: qty 1

## 2011-02-28 MED ORDER — PRENATAL MULTIVITAMIN CH
1.0000 | ORAL_TABLET | Freq: Every day | ORAL | Status: DC
  Administered 2011-03-02: 1 via ORAL

## 2011-02-28 NOTE — Progress Notes (Signed)
Post Partum Day 1 Subjective: no complaints  Objective: Blood pressure 129/82, pulse 79, temperature 97.7 F (36.5 C), temperature source Oral, resp. rate 20, SpO2 99.00%, unknown if currently breastfeeding.  Physical Exam:  General: alert, cooperative and no distress Lochia: appropriate Uterine Fundus: firm Incision: n/a DVT Evaluation: No evidence of DVT seen on physical exam.   Basename 02/27/11 1903  HGB 11.4*  HCT 34.3*    Assessment/Plan: Plan for discharge tomorrow, Breastfeeding and Contraception NPO for BTS later today   LOS: 1 day   Pamela Bridges E. 02/28/2011, 6:07 AM

## 2011-02-28 NOTE — Anesthesia Postprocedure Evaluation (Signed)
  Anesthesia Post-op Note  Patient: Pamela Bridges  Procedure(s) Performed: * No procedures listed *  Patient Location: Mother/Baby  Anesthesia Type: Epidural  Level of Consciousness: awake, alert  and oriented  Airway and Oxygen Therapy: Patient Spontanous Breathing  Post-op Pain: mild  Post-op Assessment: Post-op Vital signs reviewed, Patient's Cardiovascular Status Stable, Respiratory Function Stable, Patent Airway, No signs of Nausea or vomiting and Pain level controlled  Post-op Vital Signs: stable  Complications: No apparent anesthesia complications

## 2011-02-28 NOTE — Progress Notes (Signed)
UR chart review completed.  

## 2011-03-01 ENCOUNTER — Inpatient Hospital Stay (HOSPITAL_COMMUNITY): Payer: Medicaid Other | Admitting: Anesthesiology

## 2011-03-01 ENCOUNTER — Encounter (HOSPITAL_COMMUNITY)
Admission: AD | Disposition: A | Discharge status: Home / Self Care | Payer: Self-pay | Source: Ambulatory Visit | Attending: Obstetrics & Gynecology

## 2011-03-01 ENCOUNTER — Encounter (HOSPITAL_COMMUNITY): Payer: Self-pay | Admitting: Anesthesiology

## 2011-03-01 DIAGNOSIS — Z302 Encounter for sterilization: Secondary | ICD-10-CM

## 2011-03-01 HISTORY — PX: TUBAL LIGATION: SHX77

## 2011-03-01 SURGERY — LIGATION, FALLOPIAN TUBE, POSTPARTUM
Anesthesia: Epidural | Site: Abdomen | Laterality: Bilateral | Wound class: Clean Contaminated

## 2011-03-01 MED ORDER — HYDROMORPHONE HCL PF 1 MG/ML IJ SOLN: 0.2500 mg | INTRAMUSCULAR | Status: DC | PRN

## 2011-03-01 MED ORDER — BUPIVACAINE HCL (PF) 0.5 % IJ SOLN
INTRAMUSCULAR | Status: DC | PRN
  Administered 2011-03-01: 7 mL

## 2011-03-01 MED ORDER — MIDAZOLAM HCL 2 MG/2ML IJ SOLN
INTRAMUSCULAR | Status: AC
  Filled 2011-03-01: qty 2

## 2011-03-01 MED ORDER — ONDANSETRON HCL 4 MG/2ML IJ SOLN
INTRAMUSCULAR | Status: DC | PRN
  Administered 2011-03-01: 4 mg via INTRAVENOUS

## 2011-03-01 MED ORDER — SODIUM BICARBONATE 8.4 % IV SOLN
INTRAVENOUS | Status: AC
  Filled 2011-03-01: qty 50

## 2011-03-01 MED ORDER — MIDAZOLAM HCL 5 MG/5ML IJ SOLN
INTRAMUSCULAR | Status: DC | PRN
  Administered 2011-03-01 (×2): 1 mg via INTRAVENOUS

## 2011-03-01 MED ORDER — LIDOCAINE-EPINEPHRINE (PF) 2 %-1:200000 IJ SOLN
INTRAMUSCULAR | Status: AC
  Filled 2011-03-01: qty 20

## 2011-03-01 MED ORDER — FENTANYL CITRATE 0.05 MG/ML IJ SOLN
INTRAMUSCULAR | Status: AC
  Filled 2011-03-01: qty 2

## 2011-03-01 MED ORDER — MEPERIDINE HCL 25 MG/ML IJ SOLN: 6.2500 mg | INTRAMUSCULAR | Status: DC | PRN

## 2011-03-01 MED ORDER — SODIUM BICARBONATE 8.4 % IV SOLN
INTRAVENOUS | Status: DC | PRN
  Administered 2011-03-01: 5 mL via EPIDURAL

## 2011-03-01 MED ORDER — LACTATED RINGERS IV SOLN
INTRAVENOUS | Status: DC | PRN
  Administered 2011-03-01 (×2): via INTRAVENOUS

## 2011-03-01 MED ORDER — KETOROLAC TROMETHAMINE 30 MG/ML IJ SOLN: 15.0000 mg | Freq: Once | INTRAMUSCULAR | Status: AC | PRN

## 2011-03-01 MED ORDER — PROMETHAZINE HCL 25 MG/ML IJ SOLN: 6.2500 mg | INTRAMUSCULAR | Status: DC | PRN

## 2011-03-01 MED ORDER — FENTANYL CITRATE 0.05 MG/ML IJ SOLN
INTRAMUSCULAR | Status: DC | PRN
  Administered 2011-03-01 (×2): 50 ug via INTRAVENOUS

## 2011-03-01 SURGICAL SUPPLY — 26 items
BENZOIN TINCTURE PRP APPL 2/3 (GAUZE/BANDAGES/DRESSINGS) IMPLANT
CHLORAPREP W/TINT 26ML (MISCELLANEOUS) ×2 IMPLANT
CLIP FILSHIE TUBAL LIGA STRL (Clip) ×2 IMPLANT
CLOTH BEACON ORANGE TIMEOUT ST (SAFETY) ×2 IMPLANT
DERMABOND ADVANCED (GAUZE/BANDAGES/DRESSINGS) ×1
DERMABOND ADVANCED .7 DNX12 (GAUZE/BANDAGES/DRESSINGS) ×1 IMPLANT
ELECT REM PT RETURN 9FT ADLT (ELECTROSURGICAL) ×2
ELECTRODE REM PT RTRN 9FT ADLT (ELECTROSURGICAL) ×1 IMPLANT
GLOVE BIO SURGEON STRL SZ7 (GLOVE) ×2 IMPLANT
GLOVE BIOGEL PI IND STRL 7.0 (GLOVE) ×2 IMPLANT
GLOVE BIOGEL PI INDICATOR 7.0 (GLOVE) ×2
GOWN PREVENTION PLUS LG XLONG (DISPOSABLE) ×4 IMPLANT
NEEDLE HYPO 25X1 1.5 SAFETY (NEEDLE) ×2 IMPLANT
NS IRRIG 1000ML POUR BTL (IV SOLUTION) ×2 IMPLANT
PACK ABDOMINAL MINOR (CUSTOM PROCEDURE TRAY) ×2 IMPLANT
PENCIL BUTTON HOLSTER BLD 10FT (ELECTRODE) ×2 IMPLANT
SPONGE LAP 4X18 X RAY DECT (DISPOSABLE) ×2 IMPLANT
STRIP CLOSURE SKIN 1/2X4 (GAUZE/BANDAGES/DRESSINGS) IMPLANT
SUT CHROMIC 2 0 TIES 18 (SUTURE) IMPLANT
SUT VIC AB 0 CT1 27 (SUTURE) ×1
SUT VIC AB 0 CT1 27XBRD ANBCTR (SUTURE) ×1 IMPLANT
SUT VICRYL 4-0 PS2 18IN ABS (SUTURE) ×2 IMPLANT
SYR CONTROL 10ML LL (SYRINGE) ×2 IMPLANT
TOWEL OR 17X24 6PK STRL BLUE (TOWEL DISPOSABLE) ×4 IMPLANT
TRAY FOLEY CATH 14FR (SET/KITS/TRAYS/PACK) ×2 IMPLANT
WATER STERILE IRR 1000ML POUR (IV SOLUTION) ×2 IMPLANT

## 2011-03-01 NOTE — Transfer of Care (Signed)
Immediate Anesthesia Transfer of Care Note  Patient: Pamela Bridges  Procedure(s) Performed:  POST PARTUM TUBAL LIGATION  Patient Location: PACU  Anesthesia Type: Epidural  Level of Consciousness: awake and alert   Airway & Oxygen Therapy: Patient Spontanous Breathing and Patient connected to nasal cannula oxygen  Post-op Assessment: Report given to PACU RN  Post vital signs: Reviewed and stable  Complications: No apparent anesthesia complications

## 2011-03-01 NOTE — Interval H&P Note (Signed)
History and Physical Interval Note:  03/01/2011 1:30 PM  Pamela Bridges  has presented today for surgery, with the diagnosis undesired fertility.   Patient desires permanent sterilization.  Other reversible forms of contraception were discussed with patient; she declines all other modalities. Risks of procedure discussed with patient including but not limited to: risk of regret, permanence of method, bleeding, infection, injury to surrounding organs and need for additional procedures.  Failure risk of 0.5-1% with increased risk of ectopic gestation if pregnancy occurs was also discussed with patient.  After consideration of risks, benefits and other options for treatment, the patient has consented to  Procedure(s): POST PARTUM TUBAL LIGATION as a surgical intervention .  The patients' history has been reviewed, patient examined, no change in status, stable for surgery.  I have reviewed the patients' chart and labs.  Questions were answered to the patient's satisfaction.   To OR when ready.  Jaynie Collins, M.D. 03/01/2011 1:32 PM

## 2011-03-01 NOTE — Progress Notes (Signed)
LATE ENTRY FROM 02/28/11:  PSYCHOSOCIAL ASSESSMENT ~ MATERNAL/CHILD  Name: Pamela Bridges Age: 20  Referral Date: 03/01/11  Reason/Source: History of substance use and depression  I. FAMILY/HOME ENVIRONMENT  A. Child's Legal Guardian _X__Parent(s) ___Grandparent ___Foster parent ___DSS_________________  Name: Pamela Bridges DOB: // Age: 20  Address: [address] , [place] [address]  Name: Pamela Bridges DOB: // Age: 44  Address:  B. Other Household Members/Support Persons Name: Relationship: daughter DOB 09/05/06  Name: Relationship: daughter DOB 12/13/09  Name: Relationship: DOB ___/___/___  Name: Relationship: DOB ___/___/___  C. Other Support:  II. PSYCHOSOCIAL DATA A. Information Source _X_Patient Interview __Family Interview __Other___________ B. Financial and Community Resources __Employment:  _X_Medicaid County: [place] __Private Insurance: __Self Pay  _X_Food Stamps __WIC __Work First __Public Housing __Section 8  _X_Maternity Care Coordination/Child Service Coordination/Early Intervention: Cindy Jefferies  ___School: Grade:  __Other:  C. Cultural and Environment Information Cultural Issues Impacting Care:  III. STRENGTHS _X__Supportive family/friends  _X__Adequate Resources  ___Compliance with medical plan  _X__Home prepared for Child (including basic supplies)  ___Understanding of illness  ___Other:  RISK FACTORS AND CURRENT PROBLEMS ____No Problems Noted  History of MJ and cocaine use  Depression  SI  IV. SOCIAL WORK ASSESSMENT Sw met with 22 year old, G3P3 referred for history of substance use and depression. Pt admits to smoking MJ, "every other day" to help with an appetite during the pregnancy. She last smoked "a couple of days ago." She denies other illegal substance use and verbalized understanding of hospital drug testing policy, as her other child was positive for MJ. She denies cocaine use since 2007. UDS is positive and meconium results are  pending. Pt acknowledges that she experienced "really bad" PP depression after the birth of her daughter in 2008. She was prescribed medication of which she took briefly. She denies any depression symptoms since then. No SI since 2005. FOB is at the bedside and supportive. She has supplies for the infant and appears to be appropriate. Sw reported positive MJ results to CPS. Sw is available to assist further if needed.  V. SOCIAL WORK PLAN _X__No Further Intervention Required/No Barriers to Discharge  ___Psychosocial Support and Ongoing Assessment of Needs  ___Patient/Family Education:  _X__Child Protective Services Report County: [place] Date 03/02/11  ___Information/Referral to Community Resources_________________________  ___Other:     _X_Patient Interview  __Family Interview           __Other___________  B. Surveyor, [employer] and [employer] __Employment: _X_Medicaid    [place]:  [place]               __Private Insurance:                   __Self Pay  _X_Food Stamps   __WIC __Work First     __Public Housing     __Section 8    _X_Maternity Care Coordination/Child Service Coordination/Early Intervention: Ferdie Ping   ___School:                                                                         Grade:  __Other:   Salena Saner Cultural and Environment Information Cultural Issues Impacting Care:  III. STRENGTHS _X__Supportive family/friends _X__Adequate  Resources ___Compliance with medical plan _X__Home prepared for Child (including basic supplies) ___Understanding of illness      ___Other: RISK FACTORS AND CURRENT PROBLEMS         ____No Problems Noted              History of MJ and cocaine use Depression SI                                                                                                                                                                                                                      IV. SOCIAL WORK ASSESSMENT  Sw met with 27 year old, G3P3 referred for history of substance use and depression.  Pt admits to smoking MJ, "every other day" to help with an appetite during the pregnancy.  She last smoked "a couple of days ago."  She denies other illegal substance use and verbalized understanding of hospital drug testing policy, as her other child was positive for MJ.  She denies cocaine use since 2007.  UDS is positive and meconium results are pending.  Pt acknowledges that she experienced "really bad" PP depression after the birth of her daughter in 2008.  She was prescribed medication of which she took briefly.  She denies any depression symptoms since then.  No SI since 2005.  FOB is at the bedside and supportive.  She  has supplies for the infant and appears to be appropriate.  Sw reported positive MJ results to CPS.  Sw is available to assist further if needed.      V. SOCIAL WORK PLAN  _X__No Further Intervention Required/No Barriers to Discharge   ___Psychosocial Support and Ongoing Assessment of Needs   ___Patient/Family Education:   _X__Child Protective Services Report   County: [place] Date 03/02/11   ___Information/Referral to [employer]_________________________   ___Other:

## 2011-03-01 NOTE — Op Note (Signed)
Pamela Bridges 02/27/2011 - 03/01/2011  PREOPERATIVE DIAGNOSIS:  Multiparity, undesired fertility  POSTOPERATIVE DIAGNOSIS:  Multiparity, undesired fertility  PROCEDURE:  Postpartum Bilateral Tubal Sterilization using Filshie Clips   SURGEONS: Dr. Jaynie Collins, Dr. Candelaria Celeste  ANESTHESIA:  Epidural and local analgesia using 0.5% Marcaine  COMPLICATIONS:  None immediate.  ESTIMATED BLOOD LOSS: 10 ml.  FLUIDS: 1000 ml LR.  URINE OUTPUT:  300 ml of clear urine.  INDICATIONS: 20 y.o. [id number]  with undesired fertility,status post vaginal delivery, desires permanent sterilization.  Other reversible forms of contraception were discussed with patient; she declines all other modalities. Risks of procedure discussed with patient including but not limited to: risk of regret, permanence of method, bleeding, infection, injury to surrounding organs and need for additional procedures.  Failure risk of 0.5-1% with increased risk of ectopic gestation if pregnancy occurs was also discussed with patient.     FINDINGS:  Normal uterus, tubes, and ovaries.  PROCEDURE DETAILS: The patient was taken to the operating room where her epidural anesthesia was dosed up to surgical level and found to be adequate.  She was then placed in the dorsal supine position and prepped and draped in sterile fashion.  After an adequate timeout was performed, attention was turned to the patient's abdomen where a small transverse skin incision was made under the umbilical fold. The incision was taken down to the layer of fascia using the scalpel, and fascia was incised, and extended bilaterally using Mayo scissors. The peritoneum was entered in a sharp fashion. Attention was then turned to the patient's uterus, and left fallopian tube was identified and followed out to the fimbriated end.  A Filshie clip was placed on the left fallopian tube about 3 cm from the cornual attachment, with care given to incorporate the  underlying mesosalpinx.  A similar process was carried out on the right side allowing for bilateral tubal sterilization.  Good hemostasis was noted overall.  The instruments were then removed from the patient's abdomen and the fascial incision was repaired with 0 Vicryl, and the skin was closed with a 4-0 Vicryl subcuticular stitch. The patient tolerated the procedure well.  Instrument, sponge, and needle counts were correct times two.  The patient was then taken to the recovery room awake and in stable condition.  [name] A 03/01/2011 2:34 PM

## 2011-03-01 NOTE — Anesthesia Preprocedure Evaluation (Signed)
Anesthesia Evaluation  Patient identified by MRN, date of birth, ID band Patient awake    Reviewed: Allergy & Precautions, H&P , Patient's Chart, lab work & pertinent test results  Airway Mallampati: I TM Distance: >3 FB Neck ROM: full    Dental No notable dental hx. (+) Teeth Intact   Pulmonary neg pulmonary ROS, Current Smoker,  clear to auscultation  Pulmonary exam normal       Cardiovascular neg cardio ROS regular Normal    Neuro/Psych PSYCHIATRIC DISORDERS Negative Neurological ROS     GI/Hepatic negative GI ROS, Neg liver ROS,   Endo/Other  Negative Endocrine ROS  Renal/GU negative Renal ROSHx/o Pyelonepritis  Genitourinary negative   Musculoskeletal   Abdominal Normal abdominal exam  (+)   Peds  Hematology negative hematology ROS (+)   Anesthesia Other Findings Marijuana abuse. Dextroscoliosis lumbar spine   Reproductive/Obstetrics                           Anesthesia Physical  Anesthesia Plan  ASA: II  Anesthesia Plan: Epidural   Post-op Pain Management:    Induction:   Airway Management Planned:   Additional Equipment:   Intra-op Plan:   Post-operative Plan:   Informed Consent: I have reviewed the patients History and Physical, chart, labs and discussed the procedure including the risks, benefits and alternatives for the proposed anesthesia with the patient or authorized representative who has indicated his/her understanding and acceptance.     Plan Discussed with: Anesthesiologist and Surgeon  Anesthesia Plan Comments: (Will leave epidural in situ for PPBTL)        Anesthesia Quick Evaluation

## 2011-03-01 NOTE — Progress Notes (Signed)
Post Partum Day 2 Subjective: no complaints, up ad lib, voiding, tolerating PO and + flatus  Objective: Blood pressure 113/71, pulse 71, temperature 98.5 F (36.9 C), temperature source Oral, resp. rate 18, SpO2 98.00%, unknown if currently breastfeeding.  Physical Exam:  General: alert, cooperative and no distress Lochia: appropriate Uterine Fundus: firm DVT Evaluation: No cords or calf tenderness. No significant calf/ankle edema.   Basename 02/27/11 1903  HGB 11.4*  HCT 34.3*    Assessment/Plan: Contraception BTL to be performed this afternoon. Needs consent signedBottle feeding. Outpatient circumcision. Possible d/c home after BTL but possible overnight stay.    LOS: 2 days   HUNTER, Jeannett Senior 03/01/2011, 7:31 AM

## 2011-03-01 NOTE — Anesthesia Postprocedure Evaluation (Signed)
Anesthesia Post Note  Patient: Pamela Bridges  Procedure(s) Performed:  POST PARTUM TUBAL LIGATION  Anesthesia type: Epidurall  Patient location: PACU  Post pain: Pain level controlled  Post assessment: Post-op Vital signs reviewed  Last Vitals:  Filed Vitals:   03/01/11 1530  BP: 113/71  Pulse: 66  Temp: 36.8 C  Resp: 25    Post vital signs: Reviewed  Level of consciousness: awake  Complications: No apparent anesthesia complications

## 2011-03-01 NOTE — H&P (View-Only) (Signed)
Post Partum Day 2 Subjective: no complaints, up ad lib, voiding, tolerating PO and + flatus  Objective: Blood pressure 113/71, pulse 71, temperature 98.5 F (36.9 C), temperature source Oral, resp. rate 18, SpO2 98.00%, unknown if currently breastfeeding.  Physical Exam:  General: alert, cooperative and no distress Lochia: appropriate Uterine Fundus: firm DVT Evaluation: No cords or calf tenderness. No significant calf/ankle edema.   Basename 02/27/11 1903  HGB 11.4*  HCT 34.3*    Assessment/Plan: Contraception BTL to be performed this afternoon. Needs consent signedBottle feeding. Outpatient circumcision. Possible d/c home after BTL but possible overnight stay.    LOS: 2 days   Delores Edelstein 03/01/2011, 7:31 AM    

## 2011-03-02 MED ORDER — OXYCODONE-ACETAMINOPHEN 5-325 MG PO TABS: 1.0000 | ORAL_TABLET | ORAL | Status: AC | PRN

## 2011-03-02 MED ORDER — IBUPROFEN 600 MG PO TABS: 600.0000 mg | ORAL_TABLET | Freq: Four times a day (QID) | ORAL | Status: AC

## 2011-03-02 NOTE — Anesthesia Postprocedure Evaluation (Signed)
  Anesthesia Post-op Note  Patient: Pamela Bridges  Procedure(s) Performed:  POST PARTUM TUBAL LIGATION  Patient Location: Mother/Baby  Anesthesia Type: Epidural  Level of Consciousness: alert  and oriented  Airway and Oxygen Therapy: Patient Spontanous Breathing  Post-op Pain: mild  Post-op Assessment: Patient's Cardiovascular Status Stable and Respiratory Function Stable  Post-op Vital Signs: stable  Complications: No apparent anesthesia complications

## 2011-03-02 NOTE — Progress Notes (Signed)
Post Partum Day #3/POD#1 Subjective: no complaints and up ad lib; bottlefeeding  Objective: Blood pressure 117/75, pulse 67, temperature 98.2 F (36.8 C), temperature source Oral, resp. rate 18, SpO2 99.00%, unknown if currently breastfeeding.  Physical Exam:  General: alert, cooperative and no distress Lochia: appropriate Uterine Fundus: firm Incision: healing well (abd) DVT Evaluation: No evidence of DVT seen on physical exam.   Basename 02/27/11 1903  HGB 11.4*  HCT 34.3*    Assessment/Plan: Discharge home Rx Motrin & Percocet given F/U at HD in 2 wks   LOS: 3 days   Cam Hai 03/02/2011, 12:11 PM

## 2011-03-02 NOTE — Addendum Note (Signed)
Addendum  created 03/02/11 1610 by Fanny Dance   Modules edited:Notes Section

## 2011-03-02 NOTE — Discharge Summary (Signed)
Obstetric Discharge Summary Reason for Admission: onset of labor Prenatal Procedures: none Intrapartum Procedures: spontaneous vaginal delivery Postpartum Procedures: P.P. tubal ligation Complications-Operative and Postpartum: none Hemoglobin  Date Value Range Status  02/27/2011 11.4* 12.0-15.0 (g/dL) Final     HCT  Date Value Range Status  02/27/2011 34.3* 36.0-46.0 (%) Final    Discharge Diagnoses: Term Pregnancy-delivered and PP BTL  Discharge Information: Date: 03/02/2011 Activity: pelvic rest Diet: routine Medications: Ibuprofen and Percocet Condition: stable Instructions: refer to practice specific booklet Discharge to: home Follow-up Information    Follow up with [facility] [place] in 4 weeks. (Make an appointment for 2 weeks)    Contact information:   [address] [place] [address]          Newborn Data: Live born female  Birth Weight: 6 lb 1 oz (2750 g) APGAR: 8, 9  Home with mother.  Pamela Bridges 03/02/2011, 12:15 PM

## 2011-03-04 ENCOUNTER — Encounter (HOSPITAL_COMMUNITY): Payer: Self-pay | Admitting: Obstetrics & Gynecology

## 2011-03-11 NOTE — Progress Notes (Signed)
Post discharge review completed on dates of service from 01/23/11-01/24/11.

## 2011-04-10 ENCOUNTER — Ambulatory Visit: Payer: Self-pay | Admitting: Obstetrics and Gynecology

## 2011-04-22 ENCOUNTER — Ambulatory Visit: Payer: Self-pay | Admitting: Physician Assistant

## 2011-12-08 ENCOUNTER — Encounter (HOSPITAL_COMMUNITY): Payer: Self-pay | Admitting: *Deleted

## 2011-12-08 ENCOUNTER — Emergency Department (HOSPITAL_COMMUNITY)
Admission: EM | Admit: 2011-12-08 | Discharge: 2011-12-08 | Disposition: A | Discharge status: Home / Self Care | Payer: Medicaid Other | Attending: Emergency Medicine | Admitting: Emergency Medicine

## 2011-12-08 ENCOUNTER — Emergency Department (HOSPITAL_COMMUNITY): Payer: Medicaid Other

## 2011-12-08 DIAGNOSIS — Z91012 Allergy to eggs: Secondary | ICD-10-CM | POA: Insufficient documentation

## 2011-12-08 DIAGNOSIS — F17200 Nicotine dependence, unspecified, uncomplicated: Secondary | ICD-10-CM | POA: Insufficient documentation

## 2011-12-08 DIAGNOSIS — M654 Radial styloid tenosynovitis [de Quervain]: Secondary | ICD-10-CM | POA: Insufficient documentation

## 2011-12-08 MED ORDER — TRAMADOL HCL 50 MG PO TABS: 50.0000 mg | ORAL_TABLET | Freq: Four times a day (QID) | ORAL | Status: DC | PRN

## 2011-12-08 MED ORDER — NAPROXEN 500 MG PO TABS: 500.0000 mg | ORAL_TABLET | Freq: Two times a day (BID) | ORAL | Status: DC

## 2011-12-08 NOTE — ED Provider Notes (Signed)
History     CSN: [id number]  Arrival date & time 12/08/11  0806   First MD Initiated Contact with Patient 12/08/11 [phone]      Chief Complaint  Patient presents with  . Wrist Pain    (Consider location/radiation/quality/duration/timing/severity/associated sxs/prior treatment) The history is provided by the patient and medical records.  Pamela Bridges is a 21 y.o. female presents to the emergency department complaining of right wrist pain.  The onset of the symptoms was  gradual starting 1 week ago.  The patient has associated decreased ROM 2/2 pain.  The symptoms have been  persistent, gradually worsened.  Picking up her 20mo old son, use of the wrist, palpation makes the symptoms worse and tylenol/ibuprofen makes symptoms better for a short time.  The patient denies fever, chills, nausea, vomiting, trauma to the wrist, fall.  Patient states the pain began about a week ago without known cause.  She's been treating at home with Tylenol, ibuprofen and put a powders without relief. She states that it hurts consistently but is significantly worse when she's moving it and it is worst when she is attempting to pick up her 75-month-old son.  She states she has 2 other children who are young.  She denies swelling or bruising of the wrist. She denies loss of function or inability to hold things such as a glass of water, but states that using the wrist is painful.    Past Medical History  Diagnosis Date  . Abnormal Pap smear   . Gonorrhea   . Trichomonas   . Syphilis   . Pyelonephritis   . Anemia   . Suicide attempt   . Urinary tract infection   . Depression     no meds currently, doing ok    Past Surgical History  Procedure Date  . Wisdom tooth extraction 2005  . Tubal ligation 03/01/2011    Procedure: POST PARTUM TUBAL LIGATION;  Surgeon: Tereso Newcomer, MD;  Location: [facility];  Service: Gynecology;  Laterality: Bilateral;    Family History  Problem Relation Age of Onset  .  Anesthesia problems Neg Hx   . Learning disabilities Daughter     History  Substance Use Topics  . Smoking status: Current Every Day Smoker -- 0.2 packs/day for 13 years    Types: Cigarettes  . Smokeless tobacco: Never Used  . Alcohol Use: No     pt denies any other drug use other than THC     OB History    Grav Para Term Preterm Abortions TAB SAB Ect Mult Living   3 3 2 1      3       Review of Systems  Constitutional: Negative for fever and chills.  Musculoskeletal: Positive for joint swelling and arthralgias.  Skin: Negative for color change and wound.  Neurological: Positive for weakness (2/2 pain). Negative for numbness.  All other systems reviewed and are negative.    Allergies  Eggs or egg-derived products  Home Medications   Current Outpatient Rx  Name Route Sig Dispense Refill  . IBUPROFEN 200 MG PO TABS Oral Take 400 mg by mouth every 6 (six) hours as needed. For pain    . NAPROXEN 500 MG PO TABS Oral Take 1 tablet (500 mg total) by mouth 2 (two) times daily with a meal. 30 tablet 0  . TRAMADOL HCL 50 MG PO TABS Oral Take 1 tablet (50 mg total) by mouth every 6 (six) hours as needed for pain.  15 tablet 0    BP 126/83  Pulse 92  Temp 98.5 F (36.9 C)  Resp 20  Breastfeeding? No  Physical Exam  Nursing note and vitals reviewed. Constitutional: She is oriented to person, place, and time. She appears well-developed and well-nourished. No distress.  HENT:  Head: Normocephalic and atraumatic.  Eyes: Conjunctivae normal are normal.  Cardiovascular: Normal rate, regular rhythm, normal heart sounds and intact distal pulses.        Capillary refill < 3 seconds  Pulmonary/Chest: Effort normal and breath sounds normal. No respiratory distress. She has no wheezes. She has no rales.  Musculoskeletal: She exhibits tenderness. She exhibits no edema.       ROM: decreased ROM of the R wrist and thumb 2/2 pain; pan with palpation over the radial styloid, pain to  palpation in the anatomic snuff box Full ROM of the fingers of the R hand when thumb and wrist are stabilized Positive finklestein test on the R with accompanying shoulder movement Negative Tinel sign, grind test  Neurological: She is alert and oriented to person, place, and time. She exhibits normal muscle tone. Coordination normal.       Sensation intact to normal and sharp touch Strength decreased 2/2 pain  Skin: Skin is warm and dry. She is not diaphoretic.    ED Course  Procedures (including critical care time)  Labs Reviewed - No data to display Dg Wrist Complete Right  12/08/2011  *RADIOLOGY REPORT*  Clinical Data: Pain without known injury.  RIGHT WRIST - COMPLETE 3+ VIEW  Comparison: None.  Findings: No evidence of fracture, dislocation, degenerative change or other focal finding.  IMPRESSION: Normal radiographs.   Original Report Authenticated By: Thomasenia Sales, M.D.      1. De Quervain's tenosynovitis, right       MDM  [name] presents with R wrist pain.  Likely de Quervain's tenosynovitis vs osteoarthritis of the first [facility] joint; acute fracture unlikely.  X-ray without evidence of fracture dislocation or degenerative changes.  Will place patient in a thumb spica splint, begin naprosyn and have her follow-up with ortho.    1. Medications: naprosyn 2. Treatment: rest, ice, compression, elevation;  Wear brace until evaluated by orthopedics, take medication as prescribed 3. Follow Up: with ortho           Dierdre Forth, PA-C 12/08/11 [phone]

## 2011-12-08 NOTE — ED Notes (Signed)
Patient transported to X-ray 

## 2011-12-08 NOTE — ED Notes (Signed)
Pt in c/o right wrist pain since last Monday, denies injury, increased pain with movement, CMS intact.

## 2011-12-08 NOTE — Progress Notes (Signed)
Orthopedic Tech Progress Note Patient Details:  Pamela Bridges Mar 17, 1990 [id number]  Ortho Devices Type of Ortho Device: Thumb velcro splint Ortho Device/Splint Location: RIGHT THUMB SPICA Ortho Device/Splint Interventions: Application   Cammer, Mickie Bail 12/08/2011, 9:28 AM

## 2011-12-09 NOTE — ED Provider Notes (Signed)
Medical screening examination/treatment/procedure(s) were performed by non-physician practitioner and as supervising physician I was immediately available for consultation/collaboration.  Juliet Rude. Rubin Payor, MD 12/09/11 [phone]

## 2011-12-24 ENCOUNTER — Encounter (HOSPITAL_COMMUNITY): Payer: Self-pay | Admitting: *Deleted

## 2011-12-24 ENCOUNTER — Emergency Department (HOSPITAL_COMMUNITY)
Admission: EM | Admit: 2011-12-24 | Discharge: 2011-12-25 | Disposition: A | Discharge status: Home / Self Care | Payer: Medicaid Other | Attending: Emergency Medicine | Admitting: Emergency Medicine

## 2011-12-24 DIAGNOSIS — Z87440 Personal history of urinary (tract) infections: Secondary | ICD-10-CM | POA: Insufficient documentation

## 2011-12-24 DIAGNOSIS — Z862 Personal history of diseases of the blood and blood-forming organs and certain disorders involving the immune mechanism: Secondary | ICD-10-CM | POA: Insufficient documentation

## 2011-12-24 DIAGNOSIS — N739 Female pelvic inflammatory disease, unspecified: Secondary | ICD-10-CM

## 2011-12-24 DIAGNOSIS — Z8619 Personal history of other infectious and parasitic diseases: Secondary | ICD-10-CM | POA: Insufficient documentation

## 2011-12-24 DIAGNOSIS — F17200 Nicotine dependence, unspecified, uncomplicated: Secondary | ICD-10-CM | POA: Insufficient documentation

## 2011-12-24 DIAGNOSIS — Z9851 Tubal ligation status: Secondary | ICD-10-CM | POA: Insufficient documentation

## 2011-12-24 DIAGNOSIS — Z8659 Personal history of other mental and behavioral disorders: Secondary | ICD-10-CM | POA: Insufficient documentation

## 2011-12-24 LAB — CBC WITH DIFFERENTIAL/PLATELET
Eosinophils Relative: 3 % (ref 0–5)
HCT: 40.2 % (ref 36.0–46.0)
Hemoglobin: 13.2 g/dL (ref 12.0–15.0)
Lymphocytes Relative: 56 % — ABNORMAL HIGH (ref 12–46)
Lymphs Abs: 4.7 10*3/uL — ABNORMAL HIGH (ref 0.7–4.0)
MCH: 28.1 pg (ref 26.0–34.0)
MCV: 85.7 fL (ref 78.0–100.0)
Monocytes Relative: 6 % (ref 3–12)
Platelets: 294 10*3/uL (ref 150–400)
RBC: 4.69 MIL/uL (ref 3.87–5.11)
WBC: 8.4 10*3/uL (ref 4.0–10.5)

## 2011-12-24 LAB — URINALYSIS, ROUTINE W REFLEX MICROSCOPIC
Bilirubin Urine: NEGATIVE
Glucose, UA: NEGATIVE mg/dL
Hgb urine dipstick: NEGATIVE
Specific Gravity, Urine: 1.011 (ref 1.005–1.030)
Urobilinogen, UA: 0.2 mg/dL (ref 0.0–1.0)
pH: 8 (ref 5.0–8.0)

## 2011-12-24 LAB — POCT PREGNANCY, URINE: Preg Test, Ur: NEGATIVE

## 2011-12-24 LAB — POCT I-STAT, CHEM 8
BUN: 7 mg/dL (ref 6–23)
Calcium, Ion: 1.26 mmol/L — ABNORMAL HIGH (ref 1.12–1.23)
Chloride: 100 mEq/L (ref 96–112)
Creatinine, Ser: 0.8 mg/dL (ref 0.50–1.10)
Sodium: 140 mEq/L (ref 135–145)

## 2011-12-24 LAB — URINE MICROSCOPIC-ADD ON

## 2011-12-24 MED ORDER — HYDROCODONE-ACETAMINOPHEN 5-325 MG PO TABS
2.0000 | ORAL_TABLET | Freq: Once | ORAL | Status: AC
  Administered 2011-12-24: 2 via ORAL
  Filled 2011-12-24: qty 2

## 2011-12-24 MED ORDER — ONDANSETRON 4 MG PO TBDP
8.0000 mg | ORAL_TABLET | Freq: Once | ORAL | Status: AC
  Administered 2011-12-24: 8 mg via ORAL
  Filled 2011-12-24: qty 2

## 2011-12-24 NOTE — ED Notes (Signed)
Pt. Reports abdominal pain for x1 day. States N/V starting this AM. Denies fever.

## 2011-12-24 NOTE — ED Notes (Signed)
PT is here with lower abdominal pain for 2 days, constipation, and vomiting.

## 2011-12-24 NOTE — ED Provider Notes (Signed)
History     CSN: [id number]  Arrival date & time 12/24/11  1646   First MD Initiated Contact with Patient 12/24/11 2145      No chief complaint on file.   (Consider location/radiation/quality/duration/timing/severity/associated sxs/prior treatment) HPI Comments: Patient is a 21 year old otherwise healthy female who presents with a 2 day history of lower abdominal pain. The pain started gradually and progressively worsened. The pain is sharp and severe and does not radiate. Her LMP was 2 weeks ago. She reports associated nausea and 5 episodes of vomiting today. No aggravating/alleviating symptoms. She has not tried anything for symptom relief. She denies fever, headache, chest pain, SOB, diarrhea, dysuria, abnormal vaginal bleeding/discharge.    Past Medical History  Diagnosis Date  . Abnormal Pap smear   . Gonorrhea   . Trichomonas   . Syphilis   . Pyelonephritis   . Anemia   . Suicide attempt   . Urinary tract infection   . Depression     no meds currently, doing ok    Past Surgical History  Procedure Date  . Wisdom tooth extraction 2005  . Tubal ligation 03/01/2011    Procedure: POST PARTUM TUBAL LIGATION;  Surgeon: Tereso Newcomer, MD;  Location: [facility];  Service: Gynecology;  Laterality: Bilateral;    Family History  Problem Relation Age of Onset  . Anesthesia problems Neg Hx   . Learning disabilities Daughter     History  Substance Use Topics  . Smoking status: Current Every Day Smoker -- 0.2 packs/day for 13 years    Types: Cigarettes  . Smokeless tobacco: Never Used  . Alcohol Use: No     pt denies any other drug use other than THC     OB History    Grav Para Term Preterm Abortions TAB SAB Ect Mult Living   3 3 2 1      3       Review of Systems  Gastrointestinal: Positive for nausea, vomiting and abdominal pain.  All other systems reviewed and are negative.    Allergies  Eggs or egg-derived products  Home Medications  No current outpatient  prescriptions on file.  BP 185/74  Pulse 67  Temp 98.2 F (36.8 C) (Oral)  Resp 19  SpO2 100%  LMP 12/05/2011  Physical Exam  Nursing note and vitals reviewed. Constitutional: She is oriented to person, place, and time. She appears well-developed and well-nourished. No distress.  HENT:  Head: Normocephalic and atraumatic.  Mouth/Throat: Oropharynx is clear and moist. No oropharyngeal exudate.  Eyes: Conjunctivae normal and EOM are normal. Pupils are equal, round, and reactive to light.  Neck: Normal range of motion. Neck supple.  Cardiovascular: Normal rate and regular rhythm.  Exam reveals no gallop and no friction rub.   No murmur heard. Pulmonary/Chest: Effort normal and breath sounds normal. She has no wheezes. She has no rales. She exhibits no tenderness.  Abdominal: Soft. She exhibits no distension. There is tenderness. There is no rebound and no guarding.       Generalized lower abdominal tenderness to palpation.   Genitourinary:       Copious amount of malodorous, white vaginal discharge noted. Cervical motion tenderness noted on bimanual exam. No abnormal masses palpated.   Musculoskeletal: Normal range of motion.  Neurological: She is alert and oriented to person, place, and time. Coordination normal.       Speech is goal-oriented. Moves limbs without ataxia.   Skin: Skin is warm and dry. She  is not diaphoretic.  Psychiatric: She has a normal mood and affect. Her behavior is normal.    ED Course  Procedures (including critical care time)  Labs Reviewed  URINALYSIS, ROUTINE W REFLEX MICROSCOPIC - Abnormal; Notable for the following:    APPearance HAZY (*)     Leukocytes, UA TRACE (*)     All other components within normal limits  CBC WITH DIFFERENTIAL - Abnormal; Notable for the following:    Neutrophils Relative 35 (*)     Lymphocytes Relative 56 (*)     Lymphs Abs 4.7 (*)     All other components within normal limits  POCT I-STAT, CHEM 8 - Abnormal; Notable for  the following:    Calcium, Ion 1.26 (*)     All other components within normal limits  URINE MICROSCOPIC-ADD ON - Abnormal; Notable for the following:    Squamous Epithelial / LPF FEW (*)  CLUE CELLS PRESENT   Bacteria, UA FEW (*)     All other components within normal limits  POCT PREGNANCY, URINE  WET PREP, GENITAL  GC/CHLAMYDIA PROBE AMP, GENITAL   No results found.   1. PID (pelvic inflammatory disease)       MDM  10:26 PM Patient will have norco for abdominal pain and zofran for nausea. I will do a pelvic exam. Urinalysis negative for UTI. Labs unremarkable. Patient afebrile.   11:41 PM Cervical motion tenderness noted on bimanual exam of pelvic. Patient likely has PID. I will wait for wet prep to result before discharge.     Pamela Beck, PA-C 12/25/11 0120

## 2011-12-25 LAB — WET PREP, GENITAL
Trich, Wet Prep: NONE SEEN
Yeast Wet Prep HPF POC: NONE SEEN

## 2011-12-25 MED ORDER — CEFTRIAXONE SODIUM 250 MG IJ SOLR
250.0000 mg | Freq: Once | INTRAMUSCULAR | Status: AC
  Administered 2011-12-25: 250 mg via INTRAMUSCULAR
  Filled 2011-12-25: qty 250

## 2011-12-25 MED ORDER — DOXYCYCLINE HYCLATE 100 MG PO CAPS: 100.0000 mg | ORAL_CAPSULE | Freq: Two times a day (BID) | ORAL | Status: DC

## 2011-12-25 MED ORDER — LIDOCAINE HCL (PF) 1 % IJ SOLN
5.0000 mL | Freq: Once | INTRAMUSCULAR | Status: AC
  Administered 2011-12-25: 2 mL

## 2011-12-25 MED ORDER — HYDROCODONE-ACETAMINOPHEN 5-325 MG PO TABS: 2.0000 | ORAL_TABLET | ORAL | Status: DC | PRN

## 2011-12-25 MED ORDER — METRONIDAZOLE 500 MG PO TABS: 500.0000 mg | ORAL_TABLET | Freq: Two times a day (BID) | ORAL | Status: DC

## 2011-12-25 MED ORDER — LIDOCAINE HCL (PF) 1 % IJ SOLN
INTRAMUSCULAR | Status: AC
  Administered 2011-12-25: 2 mL
  Filled 2011-12-25: qty 5

## 2011-12-25 MED ORDER — PROMETHAZINE HCL 25 MG PO TABS: 25.0000 mg | ORAL_TABLET | Freq: Four times a day (QID) | ORAL | Status: DC | PRN

## 2011-12-25 NOTE — ED Notes (Signed)
Pt alert, calm, interactive, in NAD.  Family at bedside.  Pelvic exam complete, tolerated well, awaiting result.  Pain assessed. Pt updated on plan of care.

## 2011-12-26 NOTE — ED Provider Notes (Signed)
Medical screening examination/treatment/procedure(s) were performed by non-physician practitioner and as supervising physician I was immediately available for consultation/collaboration.   Gwyneth Sprout, MD 12/26/11 1341

## 2012-12-05 IMAGING — CR DG WRIST COMPLETE 3+V*R*
4 series · 4 of 4 positions shown · non-contrast
Comparison: None.

CLINICAL DATA: Pain without known injury.

RIGHT WRIST - COMPLETE 3+ VIEW

[x wrist pa right]
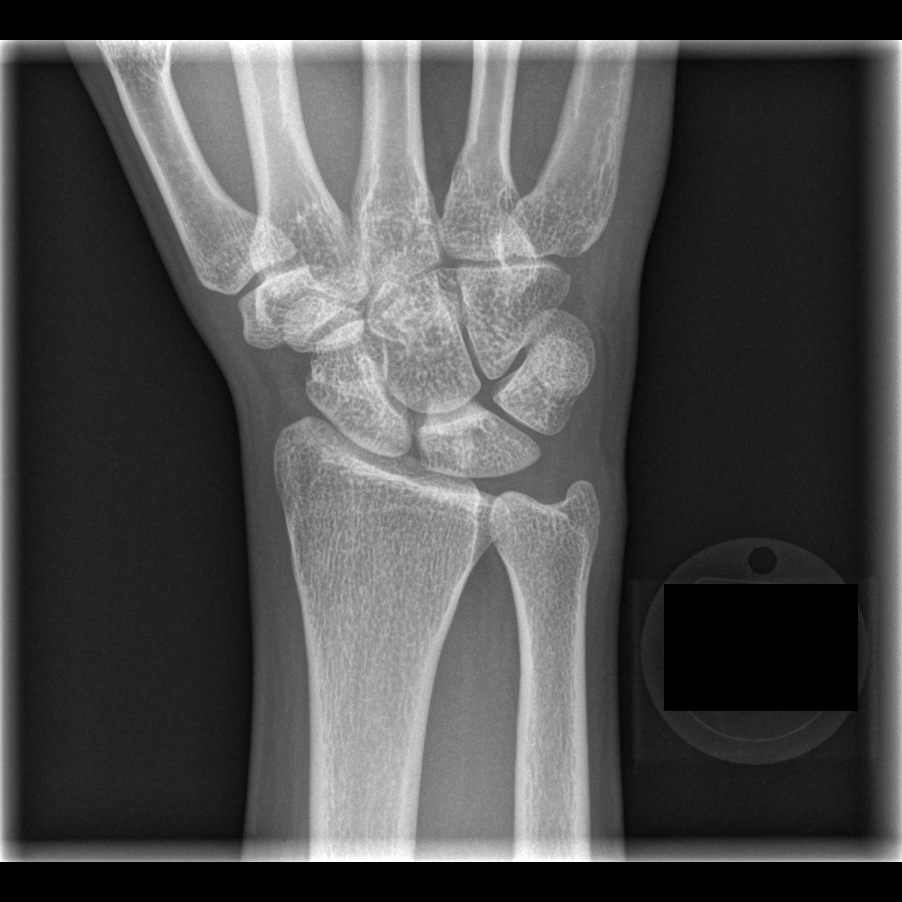

[x wrist obl right]
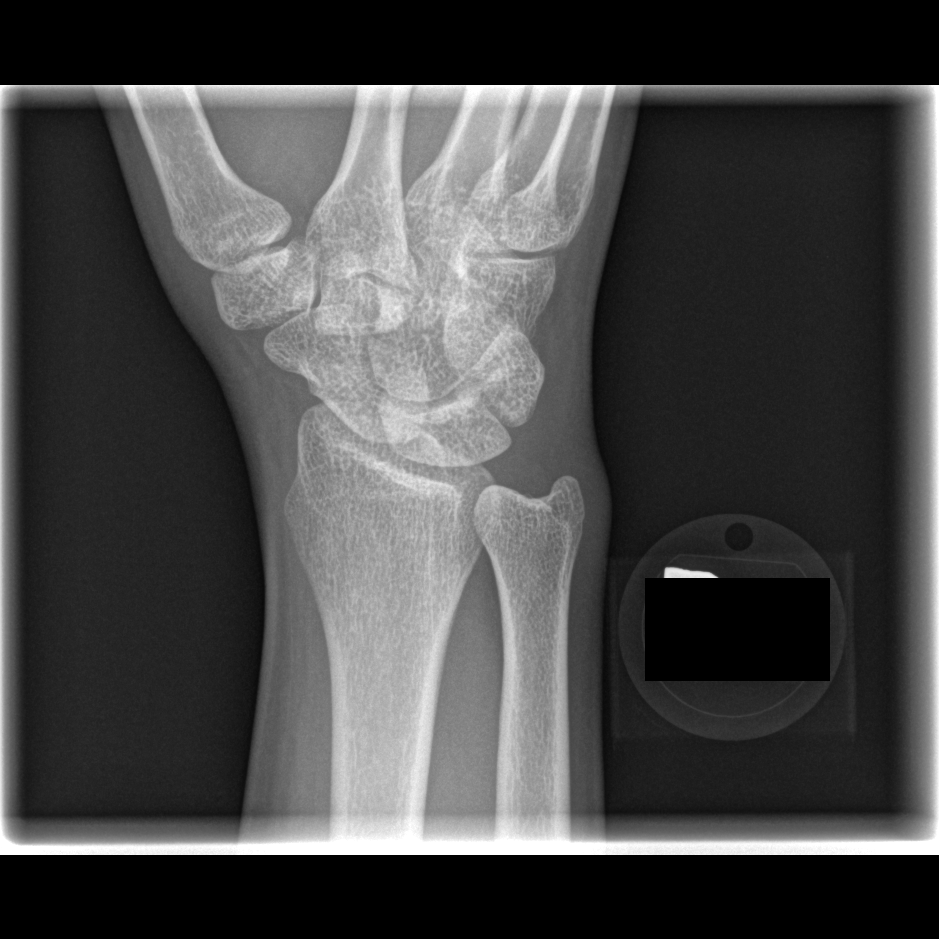

[x wrist lat right]
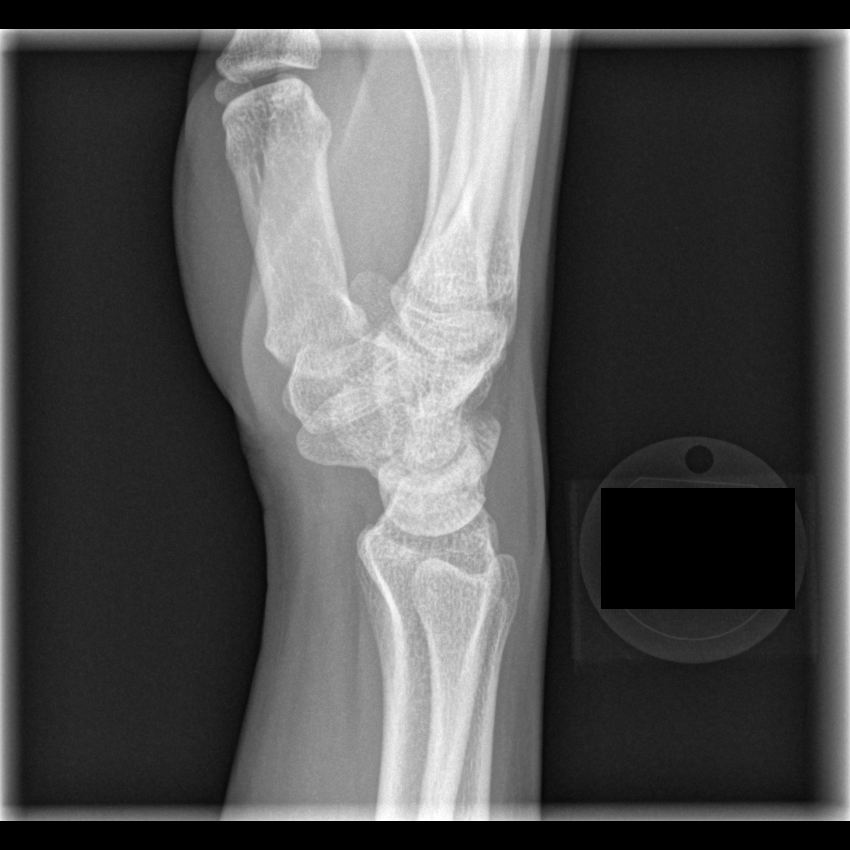

[x navicular]
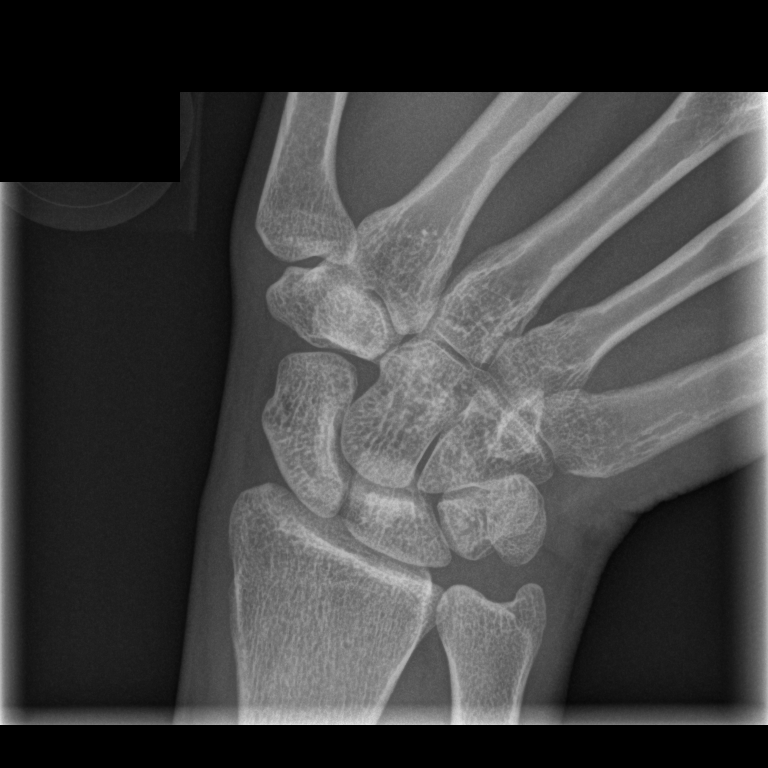

[4 of 4 positions shown; findings below may reference images not displayed]

FINDINGS: No evidence of fracture, dislocation, degenerative change
or other focal finding.
IMPRESSION: Normal radiographs.

## 2014-01-03 ENCOUNTER — Encounter (HOSPITAL_COMMUNITY): Payer: Self-pay | Admitting: *Deleted

## 2014-10-05 ENCOUNTER — Inpatient Hospital Stay (HOSPITAL_COMMUNITY): Payer: Medicaid Other

## 2014-10-05 ENCOUNTER — Inpatient Hospital Stay (HOSPITAL_COMMUNITY): Payer: Self-pay

## 2014-10-05 ENCOUNTER — Inpatient Hospital Stay (HOSPITAL_COMMUNITY)
Admission: AD | Admit: 2014-10-05 | Discharge: 2014-10-05 | DRG: 392 | Disposition: A | Discharge status: Home / Self Care | Payer: Self-pay | Source: Ambulatory Visit | Attending: Emergency Medicine | Admitting: Emergency Medicine

## 2014-10-05 ENCOUNTER — Encounter (HOSPITAL_COMMUNITY): Payer: Self-pay | Admitting: *Deleted

## 2014-10-05 DIAGNOSIS — F17210 Nicotine dependence, cigarettes, uncomplicated: Secondary | ICD-10-CM | POA: Diagnosis present

## 2014-10-05 DIAGNOSIS — R197 Diarrhea, unspecified: Secondary | ICD-10-CM | POA: Diagnosis present

## 2014-10-05 DIAGNOSIS — R509 Fever, unspecified: Secondary | ICD-10-CM | POA: Diagnosis present

## 2014-10-05 DIAGNOSIS — F329 Major depressive disorder, single episode, unspecified: Secondary | ICD-10-CM | POA: Diagnosis present

## 2014-10-05 DIAGNOSIS — R109 Unspecified abdominal pain: Secondary | ICD-10-CM

## 2014-10-05 DIAGNOSIS — R102 Pelvic and perineal pain: Secondary | ICD-10-CM

## 2014-10-05 DIAGNOSIS — R110 Nausea: Secondary | ICD-10-CM | POA: Diagnosis present

## 2014-10-05 DIAGNOSIS — F1910 Other psychoactive substance abuse, uncomplicated: Secondary | ICD-10-CM | POA: Diagnosis present

## 2014-10-05 DIAGNOSIS — Z91012 Allergy to eggs: Secondary | ICD-10-CM

## 2014-10-05 DIAGNOSIS — R1032 Left lower quadrant pain: Principal | ICD-10-CM | POA: Diagnosis present

## 2014-10-05 LAB — CBC WITH DIFFERENTIAL/PLATELET
BASOS PCT: 0 % (ref 0–1)
Basophils Absolute: 0 10*3/uL (ref 0.0–0.1)
EOS ABS: 0 10*3/uL (ref 0.0–0.7)
Eosinophils Relative: 0 % (ref 0–5)
HCT: 39.4 % (ref 36.0–46.0)
HEMOGLOBIN: 12.8 g/dL (ref 12.0–15.0)
LYMPHS PCT: 11 % — AB (ref 12–46)
Lymphs Abs: 1.7 10*3/uL (ref 0.7–4.0)
MCH: 28.3 pg (ref 26.0–34.0)
MCHC: 32.5 g/dL (ref 30.0–36.0)
MCV: 87 fL (ref 78.0–100.0)
MONO ABS: 0.8 10*3/uL (ref 0.1–1.0)
MONOS PCT: 5 % (ref 3–12)
NEUTROS PCT: 84 % — AB (ref 43–77)
Neutro Abs: 13 10*3/uL — ABNORMAL HIGH (ref 1.7–7.7)
PLATELETS: 271 10*3/uL (ref 150–400)
RBC: 4.53 MIL/uL (ref 3.87–5.11)
RDW: 12.8 % (ref 11.5–15.5)
WBC: 15.5 10*3/uL — AB (ref 4.0–10.5)

## 2014-10-05 LAB — URINALYSIS, ROUTINE W REFLEX MICROSCOPIC
Bilirubin Urine: NEGATIVE
GLUCOSE, UA: NEGATIVE mg/dL
Hgb urine dipstick: NEGATIVE
KETONES UR: 15 mg/dL — AB
Nitrite: NEGATIVE
PROTEIN: NEGATIVE mg/dL
SPECIFIC GRAVITY, URINE: 1.02 (ref 1.005–1.030)
pH: 6.5 (ref 5.0–8.0)

## 2014-10-05 LAB — RAPID URINE DRUG SCREEN, HOSP PERFORMED
Amphetamines: NOT DETECTED
Barbiturates: NOT DETECTED
Benzodiazepines: NOT DETECTED
Cocaine: POSITIVE — AB
Opiates: NOT DETECTED
TETRAHYDROCANNABINOL: POSITIVE — AB

## 2014-10-05 LAB — WET PREP, GENITAL
Trich, Wet Prep: NONE SEEN
YEAST WET PREP: NONE SEEN

## 2014-10-05 LAB — URINE MICROSCOPIC-ADD ON

## 2014-10-05 LAB — HIV ANTIBODY (ROUTINE TESTING W REFLEX): HIV SCREEN 4TH GENERATION: NONREACTIVE

## 2014-10-05 LAB — BASIC METABOLIC PANEL
Anion gap: 8 (ref 5–15)
BUN: 12 mg/dL (ref 6–20)
CO2: 25 mmol/L (ref 22–32)
Calcium: 9.2 mg/dL (ref 8.9–10.3)
Chloride: 105 mmol/L (ref 101–111)
Creatinine, Ser: 0.91 mg/dL (ref 0.44–1.00)
GFR calc Af Amer: >60 mL/min (ref >60)
GFR calc non Af Amer: >60 mL/min (ref >60)
GLUCOSE: 87 mg/dL (ref 65–99)
Potassium: 3.6 mmol/L (ref 3.5–5.1)
Sodium: 138 mmol/L (ref 135–145)

## 2014-10-05 LAB — GC/CHLAMYDIA PROBE AMP (~~LOC~~) NOT AT ARMC
Chlamydia: POSITIVE — AB
NEISSERIA GONORRHEA: POSITIVE — AB

## 2014-10-05 LAB — POCT PREGNANCY, URINE: PREG TEST UR: NEGATIVE

## 2014-10-05 LAB — RPR: RPR Ser Ql: NONREACTIVE

## 2014-10-05 MED ORDER — IOHEXOL 300 MG/ML  SOLN
50.0000 mL | Freq: Once | INTRAMUSCULAR | Status: AC | PRN
  Administered 2014-10-05: 50 mL via ORAL

## 2014-10-05 MED ORDER — FENTANYL CITRATE (PF) 100 MCG/2ML IJ SOLN
100.0000 ug | Freq: Once | INTRAMUSCULAR | Status: AC
  Administered 2014-10-05: 100 ug via INTRAVENOUS
  Filled 2014-10-05: qty 2

## 2014-10-05 MED ORDER — SODIUM CHLORIDE 0.9 % IV SOLN
1.0000 g | Freq: Once | INTRAVENOUS | Status: DC
  Filled 2014-10-05: qty 10

## 2014-10-05 MED ORDER — ACETAMINOPHEN 325 MG PO TABS
650.0000 mg | ORAL_TABLET | Freq: Once | ORAL | Status: AC
  Administered 2014-10-05: 650 mg via ORAL
  Filled 2014-10-05: qty 2

## 2014-10-05 MED ORDER — IOHEXOL 300 MG/ML  SOLN
100.0000 mL | Freq: Once | INTRAMUSCULAR | Status: AC | PRN
  Administered 2014-10-05: 100 mL via INTRAVENOUS

## 2014-10-05 MED ORDER — HYDROCODONE-ACETAMINOPHEN 5-325 MG PO TABS
1.0000 | ORAL_TABLET | Freq: Once | ORAL | Status: AC
  Administered 2014-10-05: 1 via ORAL
  Filled 2014-10-05: qty 1

## 2014-10-05 MED ORDER — SODIUM CHLORIDE 0.9 % IV SOLN: INTRAVENOUS | Status: DC

## 2014-10-05 NOTE — ED Provider Notes (Addendum)
Complains of lower abdominal pain for the past 3 days. Lower abdominal pain, constant. She's had 2 episodes of diarrhea since since this morning. No vomiting she is presently hungry. Nothing makes pain better or worse. She denies IV drug use. On exam alert Glasgow Coma Score 15 mildly uncomfortable lungs clear auscultation heart regular rate and rhythm no murmurs abdomen nondistended normal active bowel sounds tender diffusely, no guarding rigidity or rebound. Results for orders placed or performed during the hospital encounter of 10/05/14  Wet prep, genital  Result Value Ref Range   Yeast Wet Prep HPF POC NONE SEEN NONE SEEN   Trich, Wet Prep NONE SEEN NONE SEEN   Clue Cells Wet Prep HPF POC FEW (A) NONE SEEN   WBC, Wet Prep HPF POC FEW (A) NONE SEEN  Urinalysis, Routine w reflex microscopic (not at [facility])  Result Value Ref Range   Color, Urine YELLOW YELLOW   APPearance CLEAR CLEAR   Specific Gravity, Urine 1.020 1.005 - 1.030   pH 6.5 5.0 - 8.0   Glucose, UA NEGATIVE NEGATIVE mg/dL   Hgb urine dipstick NEGATIVE NEGATIVE   Bilirubin Urine NEGATIVE NEGATIVE   Ketones, ur 15 (A) NEGATIVE mg/dL   Protein, ur NEGATIVE NEGATIVE mg/dL   Urobilinogen, UA >4.0 (H) 0.0 - 1.0 mg/dL   Nitrite NEGATIVE NEGATIVE   Leukocytes, UA SMALL (A) NEGATIVE  Urine rapid drug screen (hosp performed)  Result Value Ref Range   Opiates NONE DETECTED NONE DETECTED   Cocaine POSITIVE (A) NONE DETECTED   Benzodiazepines NONE DETECTED NONE DETECTED   Amphetamines NONE DETECTED NONE DETECTED   Tetrahydrocannabinol POSITIVE (A) NONE DETECTED   Barbiturates NONE DETECTED NONE DETECTED  CBC with Differential/Platelet  Result Value Ref Range   WBC 15.5 (H) 4.0 - 10.5 K/uL   RBC 4.53 3.87 - 5.11 MIL/uL   Hemoglobin 12.8 12.0 - 15.0 g/dL   HCT 98.1 19.1 - 47.8 %   MCV 87.0 78.0 - 100.0 fL   MCH 28.3 26.0 - 34.0 pg   MCHC 32.5 30.0 - 36.0 g/dL   RDW 29.5 62.1 - 30.8 %   Platelets 271 150 - 400 K/uL   Neutrophils  Relative % 84 (H) 43 - 77 %   Neutro Abs 13.0 (H) 1.7 - 7.7 K/uL   Lymphocytes Relative 11 (L) 12 - 46 %   Lymphs Abs 1.7 0.7 - 4.0 K/uL   Monocytes Relative 5 3 - 12 %   Monocytes Absolute 0.8 0.1 - 1.0 K/uL   Eosinophils Relative 0 0 - 5 %   Eosinophils Absolute 0.0 0.0 - 0.7 K/uL   Basophils Relative 0 0 - 1 %   Basophils Absolute 0.0 0.0 - 0.1 K/uL  Comprehensive metabolic panel  Result Value Ref Range   Sodium 137 135 - 145 mmol/L   Potassium 3.6 3.5 - 5.1 mmol/L   Chloride 108 101 - 111 mmol/L   CO2 25 22 - 32 mmol/L   Glucose, Bld 99 65 - 99 mg/dL   BUN 13 6 - 20 mg/dL   Creatinine, Ser 6.57 0.44 - 1.00 mg/dL   Calcium <8.4 (LL) 8.9 - 10.3 mg/dL   Total Protein 7.7 6.5 - 8.1 g/dL   Albumin 4.3 3.5 - 5.0 g/dL   AST 21 15 - 41 U/L   ALT 13 (L) 14 - 54 U/L   Alkaline Phosphatase 66 38 - 126 U/L   Total Bilirubin 0.7 0.3 - 1.2 mg/dL   GFR calc non Af  Amer >60 >60 mL/min   GFR calc Af Amer >60 >60 mL/min   Anion gap 4 (L) 5 - 15  Urine microscopic-add on  Result Value Ref Range   Squamous Epithelial / LPF RARE RARE   WBC, UA 3-6 <3 WBC/hpf   RBC / HPF 0-2 <3 RBC/hpf   Bacteria, UA FEW (A) RARE   Urine-Other AMORPHOUS URATES/PHOSPHATES   Basic metabolic panel  Result Value Ref Range   Sodium 138 135 - 145 mmol/L   Potassium 3.6 3.5 - 5.1 mmol/L   Chloride 105 101 - 111 mmol/L   CO2 25 22 - 32 mmol/L   Glucose, Bld 87 65 - 99 mg/dL   BUN 12 6 - 20 mg/dL   Creatinine, Ser 1.61 0.44 - 1.00 mg/dL   Calcium 9.2 8.9 - 09.6 mg/dL   GFR calc non Af Amer >60 >60 mL/min   GFR calc Af Amer >60 >60 mL/min   Anion gap 8 5 - 15  Pregnancy, urine POC  Result Value Ref Range   Preg Test, Ur NEGATIVE NEGATIVE   US Pelvis Complete  10/05/2014   CLINICAL DATA:  Pelvic pain for 2 days.  EXAM: TRANSABDOMINAL ULTRASOUND OF PELVIS  TECHNIQUE: Transabdominal ultrasound examination of the pelvis was performed including evaluation of the uterus, ovaries, adnexal regions, and pelvic  cul-de-sac.  COMPARISON:  None  FINDINGS: Uterus  Measurements: 6.7 x 3.2 x 3.9 cm. No fibroids or other mass visualized.  Endometrium  Thickness: 6 mm.  No focal abnormality visualized.  Right ovary  Measurements: 3.8 x 2.3 x 3.2 cm. Normal appearance/no adnexal mass.  Left ovary  Measurements: 3.4 x 1.8 x 2.4 cm. Normal appearance/no adnexal mass.  Other findings:  No free fluid  IMPRESSION: No significant abnormality. Study limited due to patient discomfort.   Electronically Signed   By: Ellery Plunk M.D.   On: 10/05/2014 03:40   Ct Abdomen Pelvis W Contrast  10/05/2014   CLINICAL DATA:  Abdominal pain.  Leukocytosis.  EXAM: CT ABDOMEN AND PELVIS WITH CONTRAST  TECHNIQUE: Multidetector CT imaging of the abdomen and pelvis was performed using the standard protocol following bolus administration of intravenous contrast.  CONTRAST:  OMNIPAQUE IOHEXOL 300 MG/ML  SOLN  COMPARISON:  Ultrasound abdomen 10/05/2014  FINDINGS: Lower chest:  Lung bases clear  Hepatobiliary: Gallbladder and bile ducts normal. Liver homogeneous without focal mass or fluid collection.  Pancreas: Negative  Spleen: Negative  Adrenals/Urinary Tract: Normal kidneys without obstruction or stone. No renal mass. Urinary bladder empty without focal abnormality.  Stomach/Bowel: Negative for bowel obstruction or bowel edema. Oral contrast in the colon extending to the left colon without obstruction. Appendix not visualized.  Vascular/Lymphatic: Negative  Reproductive: Uterus normal in size. Bilateral fallopian tubes are positioned anteriorly just above the anterior bladder. No fluid collection in the pelvis.  Other: Negative  Musculoskeletal: Lumbar dextroscoliosis. No acute skeletal abnormality.  IMPRESSION: No acute abnormality detected.   Electronically Signed   By: Marlan Palau M.D.   On: 10/05/2014 07:44   Dg Abd Acute W/chest  10/05/2014   CLINICAL DATA:  Lower abdominal pain and diarrhea  EXAM: DG ABDOMEN ACUTE W/ 1V CHEST   COMPARISON:  None.  FINDINGS: There is no evidence of dilated bowel loops or free intraperitoneal air. No radiopaque calculi or other significant radiographic abnormality is seen. Heart size and mediastinal contours are within normal limits. Both lungs are clear.  IMPRESSION: Negative abdominal radiographs.  No acute cardiopulmonary disease.  Electronically Signed   By: Ellery Plunk M.D.   On: 10/05/2014 05:09   940 am pt ate breakfast here. Alert and ambulatory, gcs 15. Requesting more pain medication norco ordered.  No rx written Plan referral [facility]  resourse guide for substace abuse councilling Dx #1 abdominal pain #2 polysubstance abuse   Doug Sou, MD 10/05/14 0981  Doug Sou, MD 10/05/14 [id number]

## 2014-10-05 NOTE — MAU Provider Note (Signed)
History     CSN: [id number]  Arrival date and time: 10/05/14 0105   First Provider Initiated Contact with Patient 10/05/14 [phone]      Chief Complaint  Patient presents with  . Abdominal Pain   HPI  Ms. Pamela Bridges is a 24 y.o. [phone] who presents to MAU today by EMS with severe LLQ abdominal pain x 2 days. She denies N/V but did have one episode of loose stool upon arrival in MAU. She is febrile. She states that she was diagnosed with an ovarian cyst in [place] 2 years ago and believes that is causing her pain. She is sexually active and uses condoms. She denies vaginal bleeding or discharge. She has had a BTL. She initially admitted to the RN that she was recently using cocaine and marijuana, but would only admit to recent marijuana use to me.   OB History    Gravida Para Term Preterm AB TAB SAB Ectopic Multiple Living   3 3 2 1      3       Past Medical History  Diagnosis Date  . Abnormal Pap smear   . Gonorrhea   . Trichomonas   . Syphilis   . Pyelonephritis   . Anemia   . Suicide attempt   . Urinary tract infection   . Depression     no meds currently, doing ok    Past Surgical History  Procedure Laterality Date  . Wisdom tooth extraction  2005  . Tubal ligation  03/01/2011    Procedure: POST PARTUM TUBAL LIGATION;  Surgeon: Tereso Newcomer, MD;  Location: [facility];  Service: Gynecology;  Laterality: Bilateral;    Family History  Problem Relation Age of Onset  . Anesthesia problems Neg Hx   . Learning disabilities Daughter     History  Substance Use Topics  . Smoking status: Current Every Day Smoker -- 0.25 packs/day for 13 years    Types: Cigarettes  . Smokeless tobacco: Never Used  . Alcohol Use: No     Comment: pt denies any other drug use other than THC     Allergies:  Allergies  Allergen Reactions  . Eggs Or Egg-Derived Products Nausea And Vomiting    No problems with vaccines.    Prescriptions prior to admission  Medication Sig  Dispense Refill Last Dose  . doxycycline (VIBRAMYCIN) 100 MG capsule Take 1 capsule (100 mg total) by mouth 2 (two) times daily. 20 capsule 0   . HYDROcodone-acetaminophen (NORCO/VICODIN) 5-325 MG per tablet Take 2 tablets by mouth every 4 (four) hours as needed for pain. 10 tablet 0   . metroNIDAZOLE (FLAGYL) 500 MG tablet Take 1 tablet (500 mg total) by mouth 2 (two) times daily. 20 tablet 0   . promethazine (PHENERGAN) 25 MG tablet Take 0.5 tablets (12.5 mg total) by mouth every 6 (six) hours as needed for nausea. 30 tablet 0 Taking  . promethazine (PHENERGAN) 25 MG tablet Take 1 tablet (25 mg total) by mouth every 6 (six) hours as needed for nausea. 12 tablet 0     Review of Systems  Constitutional: Positive for fever. Negative for malaise/fatigue.  Gastrointestinal: Positive for abdominal pain and diarrhea. Negative for nausea, vomiting and constipation.  Genitourinary:       Neg - vaginal bleeding, discharge   Physical Exam   Blood pressure 104/67, pulse 90, temperature 100.8 F (38.2 C), temperature source Oral, resp. rate 20, height 5\' 6"  (1.676 m).  Physical Exam  Nursing  note and vitals reviewed. Constitutional: She is oriented to person, place, and time. She appears well-developed and well-nourished.  Appears uncomfortable, but also intermittently appears to fall asleep  HENT:  Head: Normocephalic and atraumatic.  Cardiovascular: Normal rate.   Respiratory: Effort normal.  GI: Soft. She exhibits no distension and no mass. Bowel sounds are decreased. There is tenderness (moderate tenderness to palpation of the suprapubic region worse on the left). There is no rebound and no guarding.  Neurological: She is alert and oriented to person, place, and time.  Skin: Skin is warm and dry. No erythema.  Psychiatric: She has a normal mood and affect. Her speech is delayed and slurred.   Results for orders placed or performed during the hospital encounter of 10/05/14 (from the past 24  hour(s))  Wet prep, genital     Status: Abnormal   Collection Time: 10/05/14  1:40 AM  Result Value Ref Range   Yeast Wet Prep HPF POC NONE SEEN NONE SEEN   Trich, Wet Prep NONE SEEN NONE SEEN   Clue Cells Wet Prep HPF POC FEW (A) NONE SEEN   WBC, Wet Prep HPF POC FEW (A) NONE SEEN  CBC with Differential/Platelet     Status: Abnormal   Collection Time: 10/05/14  2:02 AM  Result Value Ref Range   WBC 15.5 (H) 4.0 - 10.5 K/uL   RBC 4.53 3.87 - 5.11 MIL/uL   Hemoglobin 12.8 12.0 - 15.0 g/dL   HCT 96.0 45.4 - 09.8 %   MCV 87.0 78.0 - 100.0 fL   MCH 28.3 26.0 - 34.0 pg   MCHC 32.5 30.0 - 36.0 g/dL   RDW 11.9 14.7 - 82.9 %   Platelets 271 150 - 400 K/uL   Neutrophils Relative % 84 (H) 43 - 77 %   Neutro Abs 13.0 (H) 1.7 - 7.7 K/uL   Lymphocytes Relative 11 (L) 12 - 46 %   Lymphs Abs 1.7 0.7 - 4.0 K/uL   Monocytes Relative 5 3 - 12 %   Monocytes Absolute 0.8 0.1 - 1.0 K/uL   Eosinophils Relative 0 0 - 5 %   Eosinophils Absolute 0.0 0.0 - 0.7 K/uL   Basophils Relative 0 0 - 1 %   Basophils Absolute 0.0 0.0 - 0.1 K/uL  Comprehensive metabolic panel     Status: Abnormal   Collection Time: 10/05/14  2:02 AM  Result Value Ref Range   Sodium 137 135 - 145 mmol/L   Potassium 3.6 3.5 - 5.1 mmol/L   Chloride 108 101 - 111 mmol/L   CO2 25 22 - 32 mmol/L   Glucose, Bld 99 65 - 99 mg/dL   BUN 13 6 - 20 mg/dL   Creatinine, Ser 5.62 0.44 - 1.00 mg/dL   Calcium <1.3 (LL) 8.9 - 10.3 mg/dL   Total Protein 7.7 6.5 - 8.1 g/dL   Albumin 4.3 3.5 - 5.0 g/dL   AST 21 15 - 41 U/L   ALT 13 (L) 14 - 54 U/L   Alkaline Phosphatase 66 38 - 126 U/L   Total Bilirubin 0.7 0.3 - 1.2 mg/dL   GFR calc non Af Amer >60 >60 mL/min   GFR calc Af Amer >60 >60 mL/min   Anion gap 4 (L) 5 - 15  Urinalysis, Routine w reflex microscopic (not at [facility])     Status: Abnormal   Collection Time: 10/05/14  2:57 AM  Result Value Ref Range   Color, Urine YELLOW YELLOW   APPearance CLEAR  CLEAR   Specific Gravity, Urine  1.020 1.005 - 1.030   pH 6.5 5.0 - 8.0   Glucose, UA NEGATIVE NEGATIVE mg/dL   Hgb urine dipstick NEGATIVE NEGATIVE   Bilirubin Urine NEGATIVE NEGATIVE   Ketones, ur 15 (A) NEGATIVE mg/dL   Protein, ur NEGATIVE NEGATIVE mg/dL   Urobilinogen, UA >1.6 (H) 0.0 - 1.0 mg/dL   Nitrite NEGATIVE NEGATIVE   Leukocytes, UA SMALL (A) NEGATIVE  Urine microscopic-add on     Status: Abnormal   Collection Time: 10/05/14  2:57 AM  Result Value Ref Range   Squamous Epithelial / LPF RARE RARE   WBC, UA 3-6 <3 WBC/hpf   RBC / HPF 0-2 <3 RBC/hpf   Bacteria, UA FEW (A) RARE   Urine-Other AMORPHOUS URATES/PHOSPHATES   Pregnancy, urine POC     Status: None   Collection Time: 10/05/14  3:13 AM  Result Value Ref Range   Preg Test, Ur NEGATIVE NEGATIVE   US Pelvis Complete  10/05/2014   CLINICAL DATA:  Pelvic pain for 2 days.  EXAM: TRANSABDOMINAL ULTRASOUND OF PELVIS  TECHNIQUE: Transabdominal ultrasound examination of the pelvis was performed including evaluation of the uterus, ovaries, adnexal regions, and pelvic cul-de-sac.  COMPARISON:  None  FINDINGS: Uterus  Measurements: 6.7 x 3.2 x 3.9 cm. No fibroids or other mass visualized.  Endometrium  Thickness: 6 mm.  No focal abnormality visualized.  Right ovary  Measurements: 3.8 x 2.3 x 3.2 cm. Normal appearance/no adnexal mass.  Left ovary  Measurements: 3.4 x 1.8 x 2.4 cm. Normal appearance/no adnexal mass.  Other findings:  No free fluid  IMPRESSION: No significant abnormality. Study limited due to patient discomfort.   Electronically Signed   By: Ellery Plunk M.D.   On: 10/05/2014 03:40    MAU Course  Procedures None  MDM UPT - negative UA, wet prep, GC/Chlamydia, CBC, CMP, HIV, RPR and [place] today No pain medication given. Will await UDS results. Patient initially refused to get up to the bathroom, but after preparing for catheterization patient decided to collect sample.  Critical value of < 4.0 Calcium called to MAU.  Discussed patient  presentation, VS and lab results with Dr. Adrian Blackwater. He recommends drawing ionized calcium and replacing calcium gluconate IV. With normal GYN [place] patient will require transfer to [facility] for further evaluation and management.  Patient refused transvaginal US. Both ovaries visualized on pelvic US without masses.  Discussed patient with Dr. Rubin Payor at [facility]. He will accept transfer.   Assessment and Plan  A: LLQ abdominal pain, most likely GI origin Fever Sever hypocalcemia  P: IV NS  1 G IV calcium gluconate UDS pending Transfer patient to [facility] for further evaluation of LLQ pain and possible admission for hypocalcemia  Marny Lowenstein, PA-C  10/05/2014, 4:02 AM

## 2014-10-05 NOTE — ED Provider Notes (Signed)
CSN: [id number]     Arrival date & time 10/05/14  0105 History   First MD Initiated Contact with Patient 10/05/14 [phone]     Chief Complaint  Patient presents with  . Abdominal Pain     (Consider location/radiation/quality/duration/timing/severity/associated sxs/prior Treatment) Patient is a 24 y.o. female presenting with abdominal pain.  Abdominal Pain Associated symptoms: diarrhea, fever and nausea   Associated symptoms: no chest pain and no dysuria    patient was sent from [facility] for abdominal pain. Reportedly pain began a day or 2 ago. Somewhat difficult to get the history from here. Had negative ultrasound and pelvic exam there. Has a fever. Patient states she has now started to have diarrhea. She's had nausea. The pain is in her lower abdomen. She thought it was a cyst, which she has had a past. Also found to be hypocalcemic at [facility].  Past Medical History  Diagnosis Date  . Abnormal Pap smear   . Gonorrhea   . Trichomonas   . Syphilis   . Pyelonephritis   . Anemia   . Suicide attempt   . Urinary tract infection   . Depression     no meds currently, doing ok   Past Surgical History  Procedure Laterality Date  . Wisdom tooth extraction  2005  . Tubal ligation  03/01/2011    Procedure: POST PARTUM TUBAL LIGATION;  Surgeon: Tereso Newcomer, MD;  Location: [facility];  Service: Gynecology;  Laterality: Bilateral;   Family History  Problem Relation Age of Onset  . Anesthesia problems Neg Hx   . Learning disabilities Daughter    History  Substance Use Topics  . Smoking status: Current Every Day Smoker -- 0.25 packs/day for 13 years    Types: Cigarettes  . Smokeless tobacco: Never Used  . Alcohol Use: No     Comment: pt denies any other drug use other than THC    OB History    Gravida Para Term Preterm AB TAB SAB Ectopic Multiple Living   3 3 2 1      3      Review of Systems  Constitutional: Positive for fever and appetite change.  Respiratory:  Negative for chest tightness.   Cardiovascular: Negative for chest pain.  Gastrointestinal: Positive for nausea, abdominal pain and diarrhea.  Genitourinary: Negative for dysuria.  Musculoskeletal: Negative for back pain.  Skin: Negative for wound.  Neurological: Negative for numbness.  Hematological: Negative for adenopathy.  Psychiatric/Behavioral: Negative for confusion.      Allergies  Eggs or egg-derived products  Home Medications   Prior to Admission medications   Medication Sig Start Date End Date Taking? Authorizing Provider  doxycycline (VIBRAMYCIN) 100 MG capsule Take 1 capsule (100 mg total) by mouth 2 (two) times daily. Patient not taking: Reported on 10/05/2014 12/25/11   [name], PA-C  HYDROcodone-acetaminophen (NORCO/VICODIN) 5-325 MG per tablet Take 2 tablets by mouth every 4 (four) hours as needed for pain. Patient not taking: Reported on 10/05/2014 12/25/11   [name], PA-C  metroNIDAZOLE (FLAGYL) 500 MG tablet Take 1 tablet (500 mg total) by mouth 2 (two) times daily. Patient not taking: Reported on 10/05/2014 12/25/11   [name], PA-C  promethazine (PHENERGAN) 25 MG tablet Take 0.5 tablets (12.5 mg total) by mouth every 6 (six) hours as needed for nausea. Patient not taking: Reported on 10/05/2014 02/12/11 02/19/11  Levada Schilling, MD  promethazine (PHENERGAN) 25 MG tablet Take 1 tablet (25 mg total) by mouth every 6 (  six) hours as needed for nausea. Patient not taking: Reported on 10/05/2014 12/25/11   [name], PA-C   BP 146/86 mmHg  Pulse 84  Temp(Src) 100.9 F (38.3 C) (Oral)  Resp 17  Ht  (1.676 m)  SpO2 98%  LMP 09/07/2014 Physical Exam  Constitutional: She appears well-developed.  HENT:  Head: Atraumatic.  Eyes: Pupils are equal, round, and reactive to light.  Cardiovascular: Normal rate.   Pulmonary/Chest: Effort normal.  Abdominal: There is tenderness.  Diffuse tenderness with some guarding.   Musculoskeletal: Normal range of motion.  Neurological: She is alert.  Skin: There is erythema.    ED Course  Procedures (including critical care time) Labs Review Labs Reviewed  WET PREP, GENITAL - Abnormal; Notable for the following:    Clue Cells Wet Prep HPF POC FEW (*)    WBC, Wet Prep HPF POC FEW (*)    All other components within normal limits  URINALYSIS, ROUTINE W REFLEX MICROSCOPIC (NOT AT [facility]) - Abnormal; Notable for the following:    Ketones, ur 15 (*)    Urobilinogen, UA >8.0 (*)    Leukocytes, UA SMALL (*)    All other components within normal limits  URINE RAPID DRUG SCREEN, HOSP PERFORMED - Abnormal; Notable for the following:    Cocaine POSITIVE (*)    Tetrahydrocannabinol POSITIVE (*)    All other components within normal limits  CBC WITH DIFFERENTIAL/PLATELET - Abnormal; Notable for the following:    WBC 15.5 (*)    Neutrophils Relative % 84 (*)    Neutro Abs 13.0 (*)    Lymphocytes Relative 11 (*)    All other components within normal limits  COMPREHENSIVE METABOLIC PANEL - Abnormal; Notable for the following:    Calcium <4.0 (*)    ALT 13 (*)    Anion gap 4 (*)    All other components within normal limits  URINE MICROSCOPIC-ADD ON - Abnormal; Notable for the following:    Bacteria, UA FEW (*)    All other components within normal limits  BASIC METABOLIC PANEL  HIV ANTIBODY (ROUTINE TESTING)  RPR  POCT PREGNANCY, URINE  GC/CHLAMYDIA PROBE AMP () NOT AT [facility]    Imaging Review US Pelvis Complete  10/05/2014   CLINICAL DATA:  Pelvic pain for 2 days.  EXAM: TRANSABDOMINAL ULTRASOUND OF PELVIS  TECHNIQUE: Transabdominal ultrasound examination of the pelvis was performed including evaluation of the uterus, ovaries, adnexal regions, and pelvic cul-de-sac.  COMPARISON:  None  FINDINGS: Uterus  Measurements: 6.7 x 3.2 x 3.9 cm. No fibroids or other mass visualized.  Endometrium  Thickness: 6 mm.  No focal abnormality visualized.  Right ovary   Measurements: 3.8 x 2.3 x 3.2 cm. Normal appearance/no adnexal mass.  Left ovary  Measurements: 3.4 x 1.8 x 2.4 cm. Normal appearance/no adnexal mass.  Other findings:  No free fluid  IMPRESSION: No significant abnormality. Study limited due to patient discomfort.   Electronically Signed   By: Ellery Plunk M.D.   On: 10/05/2014 03:40   Dg Abd Acute W/chest  10/05/2014   CLINICAL DATA:  Lower abdominal pain and diarrhea  EXAM: DG ABDOMEN ACUTE W/ 1V CHEST  COMPARISON:  None.  FINDINGS: There is no evidence of dilated bowel loops or free intraperitoneal air. No radiopaque calculi or other significant radiographic abnormality is seen. Heart size and mediastinal contours are within normal limits. Both lungs are clear.  IMPRESSION: Negative abdominal radiographs.  No acute cardiopulmonary disease.   Electronically  Signed   By: Ellery Plunk M.D.   On: 10/05/2014 05:09     EKG Interpretation None      MDM   Final diagnoses:  LLQ abdominal pain  Fever, unspecified fever cause    [facility] with moderate [facility] pain[facility] Elevated white count and fever. Acute abdominal series shows no free air. CT scan pending at this time. Initial lab work from [facility] showed hypocalcemia, but on recheck it was normal.    Benjiman Core, MD 10/05/14 [phone]

## 2014-10-05 NOTE — ED Notes (Signed)
Resting quietly in recliner. Nad, vss

## 2014-10-05 NOTE — MAU Note (Signed)
Patient has been having lower abdominal pain for 2 days, has left ovarian cyst

## 2014-10-05 NOTE — ED Notes (Signed)
Pt given telephone so she can call her mother/ride.

## 2014-10-05 NOTE — ED Notes (Signed)
Bed: WU13 Expected date:  Expected time:  Means of arrival:  Comments: [facility]

## 2014-10-05 NOTE — ED Notes (Signed)
Patient transported to CT 

## 2014-10-05 NOTE — ED Notes (Signed)
Arrived per [facility]. Complaint of lower abd pain, with diarrhea

## 2014-10-05 NOTE — Discharge Instructions (Signed)
Abdominal Pain  Take Tylenol as directed for pain. Call the [facility] and community wellness Center to get a primary care physician. Call any of the numbers on the resource guide to get help with your drug problem Many things can cause abdominal pain. Usually, abdominal pain is not caused by a disease and will improve without treatment. It can often be observed and treated at home. Your health care provider will do a physical exam and possibly order blood tests and X-rays to help determine the seriousness of your pain. However, in many cases, more time must pass before a clear cause of the pain can be found. Before that point, your health care provider may not know if you need more testing or further treatment. HOME CARE INSTRUCTIONS  Monitor your abdominal pain for any changes. The following actions may help to alleviate any discomfort you are experiencing:  Only take over-the-counter or prescription medicines as directed by your health care provider.  Do not take laxatives unless directed to do so by your health care provider.  Try a clear liquid diet (broth, tea, or water) as directed by your health care provider. Slowly move to a bland diet as tolerated. SEEK MEDICAL CARE IF:  You have unexplained abdominal pain.  You have abdominal pain associated with nausea or diarrhea.  You have pain when you urinate or have a bowel movement.  You experience abdominal pain that wakes you in the night.  You have abdominal pain that is worsened or improved by eating food.  You have abdominal pain that is worsened with eating fatty foods.  You have a fever. SEEK IMMEDIATE MEDICAL CARE IF:   Your pain does not go away within 2 hours.  You keep throwing up (vomiting).  Your pain is felt only in portions of the abdomen, such as the right side or the left lower portion of the abdomen.  You pass bloody or black tarry stools. MAKE SURE YOU:  Understand these instructions.   Will watch your  condition.   Will get help right away if you are not doing well or get worse.  Document Released: 11/28/2004 Document Revised: 02/23/2013 Document Reviewed: 10/28/2012 [facility] Patient Information 2015 [place], [place]. This information is not intended to replace advice given to you by your health care provider. Make sure you discuss any questions you have with your health care provider.  Emergency Department Resource Guide 1) Find a Doctor and Pay Out of Pocket Although you won't have to find out who is covered by your insurance plan, it is a good idea to ask around and get recommendations. You will then need to call the office and see if the doctor you have chosen will accept you as a new patient and what types of options they offer for patients who are self-pay. Some doctors offer discounts or will set up payment plans for their patients who do not have insurance, but you will need to ask so you aren't surprised when you get to your appointment.  2) Contact Your Local Health Department Not all health departments have doctors that can see patients for sick visits, but many do, so it is worth a call to see if yours does. If you don't know where your local health department is, you can check in your phone book. The CDC also has a tool to help you locate your state's health department, and many state websites also have listings of all of their local health departments.  3) Find a [employer]  If your illness is not likely to be very severe or complicated, you may want to try a walk in clinic. These are popping up all over the country in pharmacies, drugstores, and shopping centers. They're usually staffed by nurse practitioners or physician assistants that have been trained to treat common illnesses and complaints. They're usually fairly quick and inexpensive. However, if you have serious medical issues or chronic medical problems, these are probably not your best option. ° °No Primary Care  Doctor: °- Call Health Connect at  832-8000 - they can help you locate a primary care doctor that  accepts your insurance, provides certain services, etc. °- Physician Referral Service- 1-800-533-3463 ° °Chronic Pain Problems: °Organization         Address  Phone   Notes  °[facility]  (336) 297-2271 Patients need to be referred by their primary care doctor.  ° °Medication Assistance: °Organization         Address  Phone   Notes  °[place] [facility] [address] °[place], [place] [address] (336) 641-8030 --Must be a resident of [place] °-- Must have NO insurance coverage whatsoever (no Medicaid/ Medicare, etc.) °-- The pt. MUST have a primary care doctor that directs their care regularly and follows them in the community °  °[facility]  (866) 331-1348   °[employer]  (888) 892-1162   ° °Agencies that provide inexpensive medical care: °Organization         Address  Phone   Notes  °[facility]  (336) 832-8035   °[facility]    (336) 832-7272   °[facility] [address] °[place], [place] [address] (336) 832-4777   °[facility] [address], °[place] (336) 271-4999   °[facility]    (336) 373-0678   °[facility]    (336) 272-1050   °[facility] ° [address], [place] Phone:  (336) 832-4444, Fax:  (336) 832-4440 Hours of Operation:  9 am - 6 pm, M-F.  Also accepts Medicaid/Medicare and self-pay.  °[facility] ° [address], [place] Phone: (336) 832-3150, Fax: (336) [phone]. Hours of Operation:  8:30 am - 5:30 pm, M-F.  Also accepts Medicaid and self-pay.  °[facility] [place] [address], [place] Phone: (336) 878-6027   °[facility] [address], [place], [place] (336)723-1848, Ext. 123 Mondays & Thursdays: 7-9 AM.  First 15 patients are seen on a first  come, first serve basis. °  ° °Medicaid-accepting [place] Providers: ° °Organization         Address  Phone   Notes  °[facility] [address], [place] (336) 641-2100 Also accepts self-pay patients.  °[facility] [address], [place] ° (336) 856-9996   °[facility] [address], [place] (336) 288-8857   °[facility] [address] [place] [address], [place] (336) 299-7000   °[facility] [address], [place]  ° (336) 373-1557 Only accepts [place] Access Medicaid patients after they have their name applied to their card.  ° °Self-Pay (no insurance) in [place]: ° °Organization         Address  Phone   Notes  °Sickle Cell Patients, [facility] [address], [place] (336)   832-1970   °[facility] [address], [place] (336) 832-4400   °[facility] [place] ° [address], [place] (336) 992-4800   °[facility]/Dr. Osei-Bonsu ° [address] [place] [address], [place] or [address], [place] (336) [phone] Phone number for both [place] and [place] locations is the same.  °[facility] [address], [place] (336) 299-0000   °[facility] [address] [place] [address], [place] or [address] (336) 852-7530 °(336) 878-2260   °[facility] [address], [place] (336) 350-1642, phone; (336) 294-5005, fax Sees patients 1st and 3rd Saturday of every month.  Must not qualify for public or private insurance (i.e. Medicaid, Medicare, [place] Health Choice, Veterans' Benefits) • Household income should be no more than 200% of the poverty level •The clinic cannot treat you if you are pregnant or think you are pregnant • Sexually transmitted diseases are not treated at the clinic.  ° ° °Dental Care: °Organization          Address  Phone  Notes  °[place] [facility] [address], [place] (336) 641-6152 Accepts children up to age 21 who are enrolled in Medicaid or [place] Health Choice; pregnant women with a Medicaid card; and children who have applied for Medicaid or [place] Health Choice, but were declined, whose parents can pay a reduced fee at time of service.  °[place] [place]  [address], [place] (336) 641-7733 Accepts children up to age 21 who are enrolled in Medicaid or [place] Health Choice; pregnant women with a Medicaid card; and children who have applied for Medicaid or [place] Health Choice, but were declined, whose parents can pay a reduced fee at time of service.  °[facility] ° [address], [place] (336) 641-4533 Patients are seen by appointment only. Walk-ins are not accepted. [facility] will see patients 18 years of age and older. °Monday - Tuesday (8am-5pm) °Most Wednesdays (8:30-5pm) °$30 per visit, cash only  °[facility] ° [address], [place] (336) 641-4533 Patients are seen by appointment only. Walk-ins are not accepted. [facility] will see patients 18 years of age and older. °One Wednesday Evening (Monthly: Volunteer Based).  $30 per visit, cash only  °[facility]  (919) 537-3737 for adults; Children under age 4, call Graduate Pediatric Dentistry at ([phone]) [phone]. Children aged 4-14, please call ([phone]) [phone] to request a pediatric application. ° Dental services are provided in all areas of dental care including fillings, crowns and bridges, complete and partial dentures, implants, gum treatment, root canals, and extractions. Preventive care is also provided. Treatment is provided to both adults and children. °Patients are selected via a lottery and there is often a waiting list. °  °[facility] [address], °[place] ° (336) 763-8833 [email or web address] °  °[facility] [address], [place], [place] (336)723-1848, Ext. 123 Second and Fourth Thursday of each month, opens at 6:30 AM; Clinic ends at 9 AM.  Patients are seen on a first-come first-served basis, and a limited number are seen during each clinic.  ° °[facility] ° [address], [place], [place] (336) 723-7904   Eligibility Requirements °You must have lived in [place], [place], or [place] counties for   at least the last three months. °  You cannot be eligible for state or federal sponsored healthcare insurance, including Veterans Administration, Medicaid, or Medicare. °  You generally cannot be eligible for healthcare insurance through your employer.  °  How to apply: °Eligibility screenings are held every Tuesday and Wednesday afternoon from 1:00 pm until 4:00 pm. You do not need an appointment for the interview!  °[facility] [address], [place], [place] 336-631-2330   °[facility] [facility]  336-342-8273   °[facility]  336-703-3100   °[facility]  336-570-6415   ° °Behavioral Health Resources in the Community: °Intensive Outpatient Programs °Organization         Address  Phone  Notes  °[facility] [address], [place], [place] 336-878-6098   °[facility] [address], [place], [place] 336-832-9800   °[facility] [address], [place], [place] ° 336-882-2125   °[facility] [address],  °[place], [place] 1-800-853-5163 or 336-641-4981   °Substance Abuse Resources °Organization         Address  Phone  Notes  °[facility]  336-882-2125   °[facility]  336-784-9470   °[facility]  336-285-9073   °[facility]  336-845-3988   °Residential & Outpatient Substance Abuse Program  1-800-659-3381   °Psychological  Services °Organization         Address  Phone  Notes  ° Health  336- 832-9600   °[facility]  336- 378-7881   °[facility] [address], [place] 1-800-853-5163 or 336-641-4981   ° °Mobile Crisis Teams °Organization         Address  Phone  Notes  °[facility], Mobile Crisis Care Unit  1-877-626-1772   °Assertive °[facility] ° [address] [place], [place] 336-834-9664   °Sharon DeEsch [address] °[place] 336-554-5454   ° °Self-Help/Support Groups °Organization         Address  Phone             Notes  °[facility] - variety of support groups  336- 373-1402 Call for more information  °Narcotics Anonymous (NA), [facility] [address], °[place]  2 meetings at this location  ° °Residential Treatment Programs °Organization         Address  Phone  Notes  °[facility] [address],    °[place]  1-866-801-8205   °[facility] ° [address], [place], [place] 704-293-8524   °[facility] [address], [place] 336-845-3988 Admissions: 8am-3pm M-F  °[facility] [address]    °[place], [place] 336-841-1104   °[facility] [address], [place], [place] 336-379-7146   °[facility] [address]  °[place], [place] 336-285-9073   °[facility] - Intensive Outpatient [address], [place], [place] 336-852-3033   °[facility] [address]  °[place], [place] 1-877-615-2722 or 336-784-9470   °[facility] ([facility]) [address] [place],  336-227-7417 Accepts Medicaid  °[facility] [address]  °[place]  1-800-659-3381 Substance Abuse/Addiction Treatment  ° °[place] Behavioral Health Resources °Organization         Address  Phone  Notes  °[facility]  (888)    846-9629971-837-9107   Angie FavaJulie Brannon, PhD [address] [place], [place]   (234)[phone](336) [phone] or [phone](336) ([phone]   [facility]   [address] [place], [place] (339)[phone](336) [phone]   [facility] [address], [place], [place] 330-761-4411(336) [phone] Insurance/Medicaid/sponsorship through [facility] [address]                                    [place], [place] [phone](336) [phone] Therapy/tele-psych/case  [facility] [address]   [place], [place] ([phone](336) [phone]    Dr. Lolly MustacheArfeen  ([phone]) [phone](336) [phone]   [facility]  [facility] 1) [address],  2) [address], [place] 3)  [address], [place] [phone](336) [phone](336) [phone]  [phone](336) [phone]   [facility] 947-836-3812(336) (443)[phone] or [phone](336) [phone] (After Hours)

## 2014-10-05 NOTE — ED Notes (Signed)
Verbalized understanding discharge instructions. In no acute distress.   

## 2014-10-06 ENCOUNTER — Encounter (HOSPITAL_COMMUNITY): Payer: Self-pay | Admitting: *Deleted

## 2014-10-06 LAB — COMPREHENSIVE METABOLIC PANEL
ALBUMIN: 4.3 g/dL (ref 3.5–5.0)
ALT: 13 U/L — ABNORMAL LOW (ref 14–54)
ANION GAP: 4 — AB (ref 5–15)
AST: 21 U/L (ref 15–41)
Alkaline Phosphatase: 66 U/L (ref 38–126)
BILIRUBIN TOTAL: 0.7 mg/dL (ref 0.3–1.2)
BUN: 13 mg/dL (ref 6–20)
CALCIUM: 9.3 mg/dL (ref 8.9–10.3)
CO2: 25 mmol/L (ref 22–32)
Chloride: 108 mmol/L (ref 101–111)
Creatinine, Ser: 0.96 mg/dL (ref 0.44–1.00)
GFR calc non Af Amer: >60 mL/min (ref >60)
GLUCOSE: 99 mg/dL (ref 65–99)
POTASSIUM: 3.6 mmol/L (ref 3.5–5.1)
Sodium: 137 mmol/L (ref 135–145)
TOTAL PROTEIN: 7.7 g/dL (ref 6.5–8.1)

## 2014-10-06 LAB — CALCIUM, IONIZED: Calcium, Ionized, Serum: 5.1 mg/dL (ref 4.5–5.6)

## 2017-05-06 ENCOUNTER — Emergency Department (HOSPITAL_COMMUNITY)
Admission: EM | Admit: 2017-05-06 | Discharge: 2017-05-06 | Disposition: A | Discharge status: Home / Self Care | Payer: Self-pay | Attending: Emergency Medicine | Admitting: Emergency Medicine

## 2017-05-06 ENCOUNTER — Emergency Department (HOSPITAL_COMMUNITY): Payer: Self-pay

## 2017-05-06 ENCOUNTER — Other Ambulatory Visit: Payer: Self-pay

## 2017-05-06 ENCOUNTER — Encounter (HOSPITAL_COMMUNITY): Payer: Self-pay | Admitting: Emergency Medicine

## 2017-05-06 DIAGNOSIS — F1910 Other psychoactive substance abuse, uncomplicated: Secondary | ICD-10-CM

## 2017-05-06 DIAGNOSIS — R1310 Dysphagia, unspecified: Secondary | ICD-10-CM | POA: Insufficient documentation

## 2017-05-06 DIAGNOSIS — F17210 Nicotine dependence, cigarettes, uncomplicated: Secondary | ICD-10-CM | POA: Insufficient documentation

## 2017-05-06 DIAGNOSIS — R1312 Dysphagia, oropharyngeal phase: Secondary | ICD-10-CM
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
## 2017-05-06 NOTE — ED Notes (Addendum)
Reports she has something in her throat that is making it very painful to swallow.  Pain in throat is causing her ear to hurt.

## 2017-05-06 NOTE — Discharge Instructions (Signed)
Your x-ray is negative for a foreign body in your mouth or throat. Recommend lozenges to coat the area for comfort. You can take ibuprofen and/or Tylenol for additional relief.
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
## 2017-08-25 ENCOUNTER — Emergency Department (HOSPITAL_COMMUNITY)
Admission: EM | Admit: 2017-08-25 | Discharge: 2017-08-26 | Disposition: A | Discharge status: Home / Self Care | Payer: Self-pay | Attending: Emergency Medicine | Admitting: Emergency Medicine

## 2017-08-25 DIAGNOSIS — R0789 Other chest pain: Secondary | ICD-10-CM | POA: Insufficient documentation

## 2017-08-25 DIAGNOSIS — Z23 Encounter for immunization: Secondary | ICD-10-CM | POA: Insufficient documentation

## 2017-08-25 DIAGNOSIS — S01111A Laceration without foreign body of right eyelid and periocular area, initial encounter: Secondary | ICD-10-CM | POA: Insufficient documentation

## 2017-08-25 DIAGNOSIS — Y929 Unspecified place or not applicable: Secondary | ICD-10-CM | POA: Insufficient documentation

## 2017-08-25 DIAGNOSIS — F17210 Nicotine dependence, cigarettes, uncomplicated: Secondary | ICD-10-CM | POA: Insufficient documentation

## 2017-08-25 DIAGNOSIS — R0781 Pleurodynia: Secondary | ICD-10-CM

## 2017-08-25 DIAGNOSIS — Y9389 Activity, other specified: Secondary | ICD-10-CM | POA: Insufficient documentation

## 2017-08-25 DIAGNOSIS — Y999 Unspecified external cause status: Secondary | ICD-10-CM | POA: Insufficient documentation

## 2017-08-26 ENCOUNTER — Emergency Department (HOSPITAL_COMMUNITY): Payer: Self-pay

## 2017-08-26 ENCOUNTER — Encounter (HOSPITAL_COMMUNITY): Payer: Self-pay | Admitting: Emergency Medicine

## 2017-08-26 MED ORDER — IBUPROFEN 600 MG PO TABS: 600.0000 mg | ORAL_TABLET | Freq: Four times a day (QID) | ORAL | 0 refills | Status: DC | PRN

## 2017-08-26 MED ORDER — HYDROCODONE-ACETAMINOPHEN 5-325 MG PO TABS
1.0000 | ORAL_TABLET | Freq: Once | ORAL | Status: AC
  Administered 2017-08-26: 1 via ORAL
  Filled 2017-08-26: qty 1

## 2017-08-26 MED ORDER — TETANUS-DIPHTH-ACELL PERTUSSIS 5-2.5-18.5 LF-MCG/0.5 IM SUSP
0.5000 mL | Freq: Once | INTRAMUSCULAR | Status: AC
  Administered 2017-08-26: 0.5 mL via INTRAMUSCULAR
  Filled 2017-08-26: qty 0.5

## 2017-08-26 MED ORDER — LIDOCAINE-EPINEPHRINE (PF) 2 %-1:200000 IJ SOLN
20.0000 mL | Freq: Once | INTRAMUSCULAR | Status: AC
  Administered 2017-08-26: 20 mL
  Filled 2017-08-26: qty 20
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
## 2018-05-26 ENCOUNTER — Emergency Department (HOSPITAL_COMMUNITY)
Admission: EM | Admit: 2018-05-26 | Discharge: 2018-05-26 | Disposition: A | Discharge status: Home / Self Care | Payer: Self-pay | Attending: Emergency Medicine | Admitting: Emergency Medicine

## 2018-05-26 ENCOUNTER — Encounter (HOSPITAL_COMMUNITY): Payer: Self-pay | Admitting: *Deleted

## 2018-05-26 ENCOUNTER — Other Ambulatory Visit: Payer: Self-pay

## 2018-05-26 DIAGNOSIS — B9689 Other specified bacterial agents as the cause of diseases classified elsewhere: Secondary | ICD-10-CM

## 2018-05-26 DIAGNOSIS — J101 Influenza due to other identified influenza virus with other respiratory manifestations: Secondary | ICD-10-CM | POA: Insufficient documentation

## 2018-05-26 DIAGNOSIS — J111 Influenza due to unidentified influenza virus with other respiratory manifestations: Secondary | ICD-10-CM

## 2018-05-26 DIAGNOSIS — F17210 Nicotine dependence, cigarettes, uncomplicated: Secondary | ICD-10-CM | POA: Insufficient documentation

## 2018-05-26 DIAGNOSIS — N760 Acute vaginitis: Secondary | ICD-10-CM | POA: Insufficient documentation

## 2018-05-26 DIAGNOSIS — R69 Illness, unspecified: Secondary | ICD-10-CM

## 2018-05-26 LAB — URINALYSIS, ROUTINE W REFLEX MICROSCOPIC
Bilirubin Urine: NEGATIVE
Glucose, UA: NEGATIVE mg/dL
Ketones, ur: NEGATIVE mg/dL
Leukocytes,Ua: NEGATIVE
Nitrite: NEGATIVE
Protein, ur: 30 mg/dL — AB
SPECIFIC GRAVITY, URINE: 1.033 — AB (ref 1.005–1.030)
pH: 5 (ref 5.0–8.0)

## 2018-05-26 LAB — WET PREP, GENITAL
Sperm: NONE SEEN
TRICH WET PREP: NONE SEEN
Yeast Wet Prep HPF POC: NONE SEEN

## 2018-05-26 LAB — POC URINE PREG, ED: PREG TEST UR: NEGATIVE

## 2018-05-26 MED ORDER — CEFTRIAXONE SODIUM 250 MG IJ SOLR
250.0000 mg | Freq: Once | INTRAMUSCULAR | Status: AC
  Administered 2018-05-26: 250 mg via INTRAMUSCULAR
  Filled 2018-05-26: qty 250

## 2018-05-26 MED ORDER — AZITHROMYCIN 250 MG PO TABS
1000.0000 mg | ORAL_TABLET | Freq: Once | ORAL | Status: AC
  Administered 2018-05-26: 1000 mg via ORAL
  Filled 2018-05-26: qty 4

## 2018-05-26 MED ORDER — METRONIDAZOLE 500 MG PO TABS: 500.0000 mg | ORAL_TABLET | Freq: Two times a day (BID) | ORAL | 0 refills | Status: AC

## 2018-05-26 MED ORDER — ONDANSETRON 4 MG PO TBDP: 4.0000 mg | ORAL_TABLET | Freq: Three times a day (TID) | ORAL | 0 refills | Status: AC | PRN

## 2018-05-26 NOTE — ED Provider Notes (Addendum)
[facility] EMERGENCY DEPARTMENT Provider Note   CSN: [id number] Arrival date & time: 05/26/18  7829  History   Chief Complaint Chief Complaint  Patient presents with  . Influenza   HPI Pamela Bridges is a 28 y.o. female with past medical history significant for syphilis, gonorrhea, tobacco abuse who presents for evaluation flulike illness.  Patient states she has had cough, rhinorrhea, sore throat, body aches and pains x2 days.  Did not receive flu vaccine.  Patient states she has multiple sick contacts with similar symptoms.  Patient states she also had one episode of NBNB emesis this morning as well as one episode of nonbloody diarrhea.  She denies any abdominal pain.  No recent history of antibiotic use or recent travel.  Denies history of exposure to coronavirus positive patients.  Patient states she is "worried" about her chronic vaginal discharge.  Patient states she has had yellow/white vaginal discharge times months.  Patient states she is sexually active and she does not use protection.  Patient requesting STD testing at this time.  States vaginal discharge does have "bad smell."  Does not follow-up with PCP and does not have OB/GYN.  Denies additional aggravating or alleviating factors.  Patient has not taken anything for her symptoms.  Denies fever, chills, headache, neck pain, neck stiffness, chest pain, shortness of breath, abdominal pain, pelvic pain, flank pain, constipation.  Patient states she has had malodorous urine, however does not have any burning or difficulty with urination.  Patient states "I have had this for a while."  History obtained from patient.  No interpreter was used.   HPI  Past Medical History:  Diagnosis Date  . Abnormal Pap smear   . Anemia   . Depression    no meds currently, doing ok  . Gonorrhea   . Pyelonephritis   . Suicide attempt (HCC)   . Syphilis   . Trichomonas   . Urinary tract infection     Patient Active  Problem List   Diagnosis Date Noted  . 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 Marijuana smoker 02/07/2011  . Consultation for female sterilization 02/07/2011  . Hx of preterm delivery, currently pregnant 01/21/2011  . Tobacco smoking complicating pregnancy 01/21/2011  . Preterm contractions 01/21/2011  . Supervision of high-risk pregnancy 01/16/2011  . Abnormal MSAFP (maternal serum alpha-fetoprotein), elevated 10/24/2010    Past Surgical History:  Procedure Laterality Date  . TUBAL LIGATION  03/01/2011   Procedure: POST PARTUM TUBAL LIGATION;  Surgeon: Tereso Newcomer, MD;  Location: [facility];  Service: Gynecology;  Laterality: Bilateral;  . WISDOM TOOTH EXTRACTION  2005     OB History    Gravida  3   Para  3   Term  2   Preterm  1   AB      Living  3     SAB      TAB      Ectopic      Multiple      Live Births  3          Home Medications    Prior to Admission medications   Medication Sig Start Date End Date Taking? Authorizing Provider  metroNIDAZOLE (FLAGYL) 500 MG tablet Take 1 tablet (500 mg total) by mouth 2 (two) times daily. 05/26/18   Rc Amison A, PA-C  ondansetron (ZOFRAN ODT) 4 MG disintegrating tablet Take 1 tablet (4 mg total) by mouth every 8 (eight) hours as needed for nausea  or vomiting. 05/26/18   Derrian Rodak A, PA-C    Family History Family History  Problem Relation Age of Onset  . Learning disabilities Daughter   . Anesthesia problems Neg Hx     Social History Social History   Tobacco Use  . Smoking status: Current Every Day Smoker    Packs/day: 1.00    Years: 13.00    Pack years: 13.00    Types: Cigarettes  . Smokeless tobacco: Never Used  Substance Use Topics  . Alcohol use: No    Comment: pt denies any other drug use other than THC   . Drug use: Yes    Frequency: 7.0 times per week    Types: Cocaine, "Crack" cocaine, Marijuana    Comment: Uses marijuana- a blunt every 2-3 days.. Denies cocaine use  during pregnancy.  Last marijuana 02/09/2011     Allergies   Eggs or egg-derived products   Review of Systems Review of Systems  Constitutional: Negative.   HENT: Positive for congestion, postnasal drip, rhinorrhea and sore throat. Negative for drooling, ear discharge, ear pain, facial swelling, sinus pressure, sinus pain, sneezing, tinnitus, trouble swallowing and voice change.   Eyes: Negative.   Respiratory: Positive for cough. Negative for apnea, choking, chest tightness, shortness of breath, wheezing and stridor.   Cardiovascular: Negative.   Gastrointestinal: Positive for diarrhea and vomiting. Negative for abdominal distention, abdominal pain, anal bleeding, blood in stool, constipation, nausea and rectal pain.  Genitourinary: Positive for vaginal discharge. Negative for decreased urine volume, difficulty urinating, dyspareunia, dysuria, enuresis, flank pain, frequency, genital sores, hematuria, menstrual problem, pelvic pain, urgency, vaginal bleeding and vaginal pain.  Musculoskeletal: Negative.   Skin: Negative.   Neurological: Negative.   All other systems reviewed and are negative.    Physical Exam Updated Vital Signs BP 137/86 (BP Location: Right Arm)   Pulse 66   Temp 98.4 F (36.9 C) (Oral)   Resp 12   Ht  (1.676 m)   SpO2 100%   BMI 24.21 kg/m   Physical Exam Vitals signs and nursing note reviewed.  Constitutional:      General: She is not in acute distress.    Appearance: She is not ill-appearing, toxic-appearing or diaphoretic.  HENT:     Head: Normocephalic and atraumatic.     Jaw: There is normal jaw occlusion.     Right Ear: Tympanic membrane, ear canal and external ear normal. There is no impacted cerumen. No hemotympanum. Tympanic membrane is not injected, scarred, perforated, erythematous, retracted or bulging.     Left Ear: Tympanic membrane, ear canal and external ear normal. There is no impacted cerumen. No hemotympanum. Tympanic membrane is  not injected, scarred, perforated, erythematous, retracted or bulging.     Ears:     Comments: No Mastoid tenderness.    Nose:     Comments: Clear rhinorrhea and congestion to bilateral nares.  No sinus tenderness.    Mouth/Throat:     Comments: Posterior oropharynx clear.  Mucous membranes moist.  Tonsils without erythema or exudate.  Uvula midline without deviation.  No evidence of PTA or RPA.  No drooling, dysphasia or trismus.  Phonation normal. Neck:     Trachea: Trachea and phonation normal.     Meningeal: Brudzinski's sign and Kernig's sign absent.     Comments: No Neck stiffness or neck rigidity.  No meningismus.  No cervical lymphadenopathy. Cardiovascular:     Comments: No murmurs rubs or gallops. Pulmonary:     Comments:  Clear to auscultation bilaterally without wheeze, rhonchi or rales.  No accessory muscle usage.  Able speak in full sentences. Abdominal:     Comments: Soft, nontender without rebound or guarding.  No CVA tenderness.  Genitourinary:    Comments: Normal appearing external female genitalia without rashes or lesions, normal vaginal epithelium.  Normal appearing cervix with discharge. No cervical petechiae. Cervical os is closed. There is no bleeding noted at the os.Moderate Odor. Bimanual: No CMT, nontender.  No palpable adnexal masses or tenderness. Uterus midline and not fixed. Rectovaginal exam was deferred. No cystocele or rectocele noted. No pelvic lymphadenopathy noted. Wet prep was obtained.  Cultures for gonorrhea and chlamydia collected. Exam performed with chaperone in room. Musculoskeletal:     Comments: Moves all 4 extremities without difficulty.  Lower extremities without edema, erythema or warmth.  Skin:    Comments: Brisk capillary refill.  No rashes or lesions.  Neurological:     Mental Status: She is alert.     Comments: Ambulatory in department without difficulty.  Cranial nerves II through XII grossly intact.  No facial droop.  No aphasia.    ED  Treatments / Results  Labs (all labs ordered are listed, but only abnormal results are displayed) Labs Reviewed  WET PREP, GENITAL - Abnormal; Notable for the following components:      Result Value   Clue Cells Wet Prep HPF POC PRESENT (*)    WBC, Wet Prep HPF POC FEW (*)    All other components within normal limits  URINALYSIS, ROUTINE W REFLEX MICROSCOPIC - Abnormal; Notable for the following components:   APPearance HAZY (*)    Specific Gravity, Urine 1.033 (*)    Hgb urine dipstick MODERATE (*)    Protein, ur 30 (*)    Bacteria, UA RARE (*)    All other components within normal limits  URINE CULTURE  HIV ANTIBODY (ROUTINE TESTING W REFLEX)  RPR  POC URINE PREG, ED  WET PREP  (BD AFFIRM) ([place])  GC/CHLAMYDIA PROBE AMP ([place]) NOT AT [facility]   EKG None  Radiology No results found.  Procedures Procedures (including critical care time)  Medications Ordered in ED Medications  cefTRIAXone (ROCEPHIN) injection 250 mg (has no administration in time range)  azithromycin (ZITHROMAX) tablet 1,000 mg (has no administration in time range)   Initial Impression / Assessment and Plan / ED Course  I have reviewed the triage vital signs and the nursing notes.  Pertinent labs & imaging results that were available during my care of the patient were reviewed by me and considered in my medical decision making (see chart for details).  28 year old female appears otherwise well presents for evaluation multiple complaints.  Afebrile, nonseptic, non-ill-appearing.  Lungs clear to auscultation bilateral without wheeze, rhonchi or rales.  No tachypnea, tachycardia or hypoxia, low suspicion for pneumonia.  She is able to speak in full sentences at difficulty.  No acute respiratory distress.  Posterior oropharynx clear.  Mucous membranes moist.  Tonsils without erythema or exudate.  Uvula midline without deviation.  No evidence of drooling, dysphasia or trismus, low suspicion for  pharyngitis.  She is able to tolerate p.o. intake in department of difficulty.  No neck stiffness or neck rigidity, low suspicion for meningitis.  Does have symptoms consistent with viral upper respiratory infection.  She does not meet CDC criteria for testing for coronavirus, however we cannot rule this out.  She did not exhibit any signs of respiratory distress. Discussed this with patient.  Encourage if develops fever to home isolate until fever resolves.  Abdomen soft, nontender without rebound or guarding.  Non-surgical abdomen.  No peritoneal signs.  Has had one episode of NBNB emesis as well as one episode of NB diarrhea.  Possible viral in nature?  Pelvic exam without cervical motion tenderness, low suspicion for PID. GC, chlamydia testing pending.  HIV and syphilis testing as well.  Patient is been treated with empiric Rocephin as well as azithromycin in emergency department.  Patient understands that she has testing pending. Wet prep with BV, Will be treated with Flagyl. Patient knows not to drink alcohol with this medication.  Patient is nontoxic, nonseptic appearing, in no apparent distress.On repeat exam patient does not have a surgical abdomin and there are no peritoneal signs.  No indication of appendicitis, bowel obstruction, bowel perforation, cholecystitis, diverticulitis, PID or ectopic pregnancy.  Urinalysis negative for infection. Urine preg negative. Given patient with one episode of emesis and one episode of diarrhea do not feel additional labs required at this time.  Patient hemodynamically stable and appropriate for DC home at this time.  I have discussed return precautions with patient.  Patient voiced understanding and is agreeable for follow-up.     Final Clinical Impressions(s) / ED Diagnoses   Final diagnoses:  Influenza-like illness  Bacterial vaginosis    ED Discharge Orders         Ordered    metroNIDAZOLE (FLAGYL) 500 MG tablet  2 times daily     05/26/18 1156     ondansetron (ZOFRAN ODT) 4 MG disintegrating tablet  Every 8 hours PRN     05/26/18 1156               Mery Guadalupe A, PA-C 05/26/18 1220    Eber Hong, MD 05/31/18 1302

## 2018-05-26 NOTE — Discharge Instructions (Addendum)
You were evaluated today for vaginal discharge and flu-like illness. You were prescribed Flagyl. Do not drink alcohol while taking this medication. You were given Rocephin and Azithromycin for gonorrhea and chlamydia. These results will take 48 hours to return.  If they are positive you will need to be verified.  HIV testing is also pending as well.  If these are positive you will need to seek reevaluation.  Likely have a flulike.  I have given you Zofran for nausea and emesis.  Please take as prescribed.  Return to the ED for any new or worsening symptoms.

## 2018-05-26 NOTE — ED Triage Notes (Signed)
Pt reports  2 day Sx's of  N/V,diarrhea ,cough for 2 days.

## 2018-05-26 NOTE — ED Notes (Signed)
Patient verbalizes understanding of discharge instructions. Opportunity for questioning and answers were provided. Armband removed by staff, pt discharged from ED. Ambulated out to lobby  

## 2018-05-27 LAB — URINE CULTURE

## 2018-05-27 LAB — HIV ANTIBODY (ROUTINE TESTING W REFLEX): HIV Screen 4th Generation wRfx: NONREACTIVE

## 2018-05-27 LAB — RPR: RPR: NONREACTIVE

## 2018-05-27 LAB — GC/CHLAMYDIA PROBE AMP (~~LOC~~) NOT AT ARMC
CHLAMYDIA, DNA PROBE: POSITIVE — AB
Neisseria Gonorrhea: POSITIVE — AB

## 2018-11-21 ENCOUNTER — Encounter (HOSPITAL_COMMUNITY): Payer: Self-pay

## 2018-11-21 ENCOUNTER — Emergency Department (HOSPITAL_COMMUNITY)
Admission: EM | Admit: 2018-11-21 | Discharge: 2018-11-21 | Disposition: A | Discharge status: Home / Self Care | Payer: No Typology Code available for payment source | Attending: Emergency Medicine | Admitting: Emergency Medicine

## 2018-11-21 ENCOUNTER — Emergency Department (HOSPITAL_COMMUNITY): Payer: No Typology Code available for payment source

## 2018-11-21 DIAGNOSIS — Y999 Unspecified external cause status: Secondary | ICD-10-CM | POA: Diagnosis not present

## 2018-11-21 DIAGNOSIS — Z79899 Other long term (current) drug therapy: Secondary | ICD-10-CM | POA: Insufficient documentation

## 2018-11-21 DIAGNOSIS — M545 Low back pain, unspecified: Secondary | ICD-10-CM

## 2018-11-21 DIAGNOSIS — Y93I9 Activity, other involving external motion: Secondary | ICD-10-CM | POA: Diagnosis not present

## 2018-11-21 DIAGNOSIS — F17210 Nicotine dependence, cigarettes, uncomplicated: Secondary | ICD-10-CM | POA: Insufficient documentation

## 2018-11-21 DIAGNOSIS — Y92410 Unspecified street and highway as the place of occurrence of the external cause: Secondary | ICD-10-CM | POA: Diagnosis not present

## 2018-11-21 MED ORDER — NAPROXEN 500 MG PO TABS
500.0000 mg | ORAL_TABLET | Freq: Once | ORAL | Status: AC
  Administered 2018-11-21: 13:00:00 500 mg via ORAL
  Filled 2018-11-21: qty 1

## 2018-11-21 MED ORDER — METHOCARBAMOL 500 MG PO TABS
500.0000 mg | ORAL_TABLET | Freq: Once | ORAL | Status: AC
  Administered 2018-11-21: 13:00:00 500 mg via ORAL
  Filled 2018-11-21: qty 1

## 2018-11-21 MED ORDER — METHOCARBAMOL 500 MG PO TABS: 500.0000 mg | ORAL_TABLET | Freq: Two times a day (BID) | ORAL | 0 refills | Status: AC

## 2018-11-21 MED ORDER — NAPROXEN 500 MG PO TABS: 500.0000 mg | ORAL_TABLET | Freq: Two times a day (BID) | ORAL | 0 refills | Status: AC

## 2018-11-21 NOTE — Discharge Instructions (Signed)
I have prescribed muscle relaxers for your pain, please do not drink or drive while taking this medications as they can make you drowsy.  Please follow-up with PCP in 1 week for reevaluation of your symptoms.  You experience any bowel or bladder incontinence, fever, worsening in your symptoms please return to the ED. ° °
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
## 2019-07-29 ENCOUNTER — Ambulatory Visit: Payer: Self-pay | Attending: Internal Medicine

## 2019-07-29 DIAGNOSIS — Z23 Encounter for immunization: Secondary | ICD-10-CM

## 2019-07-29 NOTE — Progress Notes (Signed)
   Covid-19 Vaccination Clinic  Name:  Pamela Bridges    MRN: [id number] DOB: 1990/11/22  07/29/2019  [name] was observed post Covid-19 immunization for 15 minutes without incident. She was provided with Vaccine Information Sheet and instruction to access the V-Safe system.   [name] was instructed to call 911 with any severe reactions post vaccine: Marland Kitchen Difficulty breathing  . Swelling of face and throat  . A fast heartbeat  . A bad rash all over body  . Dizziness and weakness   Immunizations Administered    Name Date Dose VIS Date Route   JANSSEN COVID-19 VACCINE 07/29/2019  2:17 PM 0.5 mL 05/01/2019 Intramuscular   Manufacturer: [employer]   Lot: [id number]   NDC: 04136-438-37   JANSSEN COVID-19 VACCINE 07/29/2019  2:21 PM 0.5 mL 05/01/2019 Intramuscular   Manufacturer: [employer]   Lot: [id number]   NDC: 79396-886-48

## 2019-10-29 ENCOUNTER — Emergency Department (HOSPITAL_COMMUNITY)
Admission: EM | Admit: 2019-10-29 | Discharge: 2019-10-29 | Disposition: A | Discharge status: Home / Self Care | Payer: Self-pay | Attending: Emergency Medicine | Admitting: Emergency Medicine

## 2019-10-29 ENCOUNTER — Other Ambulatory Visit: Payer: Self-pay

## 2019-10-29 ENCOUNTER — Emergency Department (HOSPITAL_COMMUNITY): Payer: Self-pay

## 2019-10-29 ENCOUNTER — Encounter (HOSPITAL_COMMUNITY): Payer: Self-pay

## 2019-10-29 DIAGNOSIS — Z79899 Other long term (current) drug therapy: Secondary | ICD-10-CM | POA: Insufficient documentation

## 2019-10-29 DIAGNOSIS — F17210 Nicotine dependence, cigarettes, uncomplicated: Secondary | ICD-10-CM | POA: Insufficient documentation

## 2019-10-29 DIAGNOSIS — F1210 Cannabis abuse, uncomplicated: Secondary | ICD-10-CM | POA: Insufficient documentation

## 2019-10-29 DIAGNOSIS — R202 Paresthesia of skin: Secondary | ICD-10-CM | POA: Insufficient documentation

## 2019-10-29 DIAGNOSIS — G5601 Carpal tunnel syndrome, right upper limb: Secondary | ICD-10-CM | POA: Insufficient documentation

## 2019-10-29 MED ORDER — CELECOXIB 200 MG PO CAPS: 200.0000 mg | ORAL_CAPSULE | Freq: Two times a day (BID) | ORAL | 0 refills | Status: AC

## 2019-10-29 MED ORDER — METHYLPREDNISOLONE 4 MG PO TBPK: ORAL_TABLET | ORAL | 0 refills | Status: AC

## 2019-10-29 MED ORDER — OXYCODONE-ACETAMINOPHEN 5-325 MG PO TABS
1.0000 | ORAL_TABLET | Freq: Once | ORAL | Status: AC
  Administered 2019-10-29: 1 via ORAL
  Filled 2019-10-29: qty 1

## 2019-10-29 NOTE — Discharge Instructions (Signed)
General instructions Take over-the-counter and prescription medicines only as told by your health care provider. Rest your wrist from any activity that may be causing your pain. If your condition is work related, talk with your employer about changes that can be made, such as getting a wrist pad to use while typing. Do any exercises as told by your health care provider, physical therapist, or occupational therapist. Keep all follow-up visits as told by your health care provider. This is important. Contact a health care provider if: You have new symptoms. Your pain is not controlled with medicines. Your symptoms get worse. Get help right away if: You have severe numbness or tingling in your wrist or hand.

## 2019-10-29 NOTE — ED Triage Notes (Signed)
Pt presents with Right wrist pain, hx of arthritis, pt reports medication at home is not working

## 2019-10-29 NOTE — ED Provider Notes (Signed)
[facility] EMERGENCY DEPARTMENT Provider Note   CSN: [id number] Arrival date & time: 10/29/19  0865     History Chief Complaint  Patient presents with  . Wrist Pain    Pamela Bridges is a 29 y.o. female with a past medical history of carpal tunnel syndrome.  Patient states that she has had carpal tunnel syndrome going on for a long time.  She does a lot of writing.  As of late for the past few weeks her pain has been persistently and progressively worsening.  She states that she has constant numbness and pain in her hand.  The pain sometimes wakes her from sleep.  She has been taking Tylenol at home without any relief of her symptoms.  She states she was holding off as long as she could but the pain became unbearable and she came in for further management.  She denies any injuries, she denies weakness in the right arm.  She is right-hand dominant  HPI     Past Medical History:  Diagnosis Date  . Abnormal Pap smear   . Anemia   . Depression    no meds currently, doing ok  . Gonorrhea   . Pyelonephritis   . Suicide attempt ([facility])   . Syphilis   . Trichomonas   . Urinary tract infection     Patient Active Problem List   Diagnosis Date Noted  . 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 Marijuana smoker 02/07/2011  . Consultation for female sterilization 02/07/2011  . Hx of preterm delivery, currently pregnant 01/21/2011  . Tobacco smoking complicating pregnancy 01/21/2011  . Preterm contractions 01/21/2011  . Supervision of high-risk pregnancy 01/16/2011  . Abnormal MSAFP (maternal serum alpha-fetoprotein), elevated 10/24/2010    Past Surgical History:  Procedure Laterality Date  . TUBAL LIGATION  03/01/2011   Procedure: POST PARTUM TUBAL LIGATION;  Surgeon: Tereso Newcomer, MD;  Location: [facility];  Service: Gynecology;  Laterality: Bilateral;  . WISDOM TOOTH EXTRACTION  2005     OB History    Gravida  3   Para  3   Term  2    Preterm  1   AB      Living  3     SAB      TAB      Ectopic      Multiple      Live Births  3           Family History  Problem Relation Age of Onset  . Learning disabilities Daughter   . Anesthesia problems Neg Hx     Social History   Tobacco Use  . Smoking status: Current Every Day Smoker    Packs/day: 1.00    Years: 13.00    Pack years: 13.00    Types: Cigarettes  . Smokeless tobacco: Never Used  Substance Use Topics  . Alcohol use: No    Comment: pt denies any other drug use other than THC   . Drug use: Yes    Frequency: 7.0 times per week    Types: Cocaine, "Crack" cocaine, Marijuana    Comment: Uses marijuana- a blunt every 2-3 days.. Denies cocaine use during pregnancy.  Last marijuana 02/09/2011    Home Medications Prior to Admission medications   Medication Sig Start Date End Date Taking? Authorizing Provider  celecoxib (CELEBREX) 200 MG capsule Take 1 capsule (200 mg total) by mouth 2 (two) times daily. 10/29/19   Arthor Captain, PA-C  methylPREDNISolone (MEDROL DOSEPAK) 4 MG TBPK tablet Use as directed 10/29/19   Arthor Captain, PA-C  metroNIDAZOLE (FLAGYL) 500 MG tablet Take 1 tablet (500 mg total) by mouth 2 (two) times daily. 05/26/18   Henderly, Britni A, PA-C  ondansetron (ZOFRAN ODT) 4 MG disintegrating tablet Take 1 tablet (4 mg total) by mouth every 8 (eight) hours as needed for nausea or vomiting. 05/26/18   Henderly, Britni A, PA-C    Allergies    Eggs or egg-derived products  Review of Systems   Review of Systems  Skin: Negative for color change, rash and wound.  Neurological: Positive for numbness. Negative for weakness.    Physical Exam Updated Vital Signs BP (!) 126/94 (BP Location: Right Arm)   Pulse 80   Temp 99.3 F (37.4 C) (Oral)   Resp 16   SpO2 100%   Physical Exam Vitals and nursing note reviewed.  Constitutional:      General: She is not in acute distress.    Appearance: She is well-developed. She is not  diaphoretic.  HENT:     Head: Normocephalic and atraumatic.  Eyes:     General: No scleral icterus.    Conjunctiva/sclera: Conjunctivae normal.  Cardiovascular:     Rate and Rhythm: Normal rate and regular rhythm.     Heart sounds: Normal heart sounds. No murmur heard.  No friction rub. No gallop.   Pulmonary:     Effort: Pulmonary effort is normal. No respiratory distress.     Breath sounds: Normal breath sounds.  Abdominal:     General: Bowel sounds are normal. There is no distension.     Palpations: Abdomen is soft. There is no mass.     Tenderness: There is no abdominal tenderness. There is no guarding.  Musculoskeletal:     Right wrist: Tenderness present. No bony tenderness or snuff box tenderness. Decreased range of motion. Normal pulse.     Cervical back: Normal range of motion.     Comments: + R tinnel/ phalen  Skin:    General: Skin is warm and dry.  Neurological:     Mental Status: She is alert and oriented to person, place, and time.  Psychiatric:        Behavior: Behavior normal.     ED Results / Procedures / Treatments   Labs (all labs ordered are listed, but only abnormal results are displayed) Labs Reviewed - No data to display  EKG None  Radiology DG Wrist Complete Right  Result Date: 10/29/2019 CLINICAL DATA:  Pain EXAM: RIGHT WRIST - COMPLETE 3+ VIEW COMPARISON:  December 08, 2011 FINDINGS: Frontal, oblique, lateral, and ulnar deviation scaphoid images were obtained. No fracture or dislocation. Joint spaces appear normal. No erosive change. IMPRESSION: No fracture or dislocation.  No evident arthropathy. Electronically Signed   By: Bretta Bang III M.D.   On: 10/29/2019 10:32    Procedures Procedures (including critical care time)  Medications Ordered in ED Medications  oxyCODONE-acetaminophen (PERCOCET/ROXICET) 5-325 MG per tablet 1 tablet (1 tablet Oral Given 10/29/19 1009)    ED Course  I have reviewed the triage vital signs and the nursing  notes.  Pertinent labs & imaging results that were available during my care of the patient were reviewed by me and considered in my medical decision making (see chart for details).    MDM Rules/Calculators/A&P  Symptoms consistent with carpal tunnel. No evidence of compartment syndrome, Or radiculopathy. No visible atrophy.  Advised pt to have TSH level check by PCP. Pt given removable wrist splint. Will treat with celebrex and a short course of steroid. Cryotherapy, supportive therapy discussed. Follow up with Hand. Return precautions discussed. Pt is safe for discharge at this time.    Final Clinical Impression(s) / ED Diagnoses Final diagnoses:  Carpal tunnel syndrome of right wrist    Rx / DC Orders ED Discharge Orders         Ordered    celecoxib (CELEBREX) 200 MG capsule  2 times daily        10/29/19 1130    methylPREDNISolone (MEDROL DOSEPAK) 4 MG TBPK tablet        10/29/19 1130           Arthor Captain, PA-C 10/29/19 1138    Sabino Donovan, MD 10/29/19 [phone]

## 2021-01-02 ENCOUNTER — Emergency Department (HOSPITAL_COMMUNITY)
Admission: EM | Admit: 2021-01-02 | Discharge: 2021-01-03 | Disposition: A | Discharge status: Home / Self Care | Payer: Self-pay | Attending: Emergency Medicine | Admitting: Emergency Medicine

## 2021-01-02 ENCOUNTER — Encounter (HOSPITAL_COMMUNITY): Payer: Self-pay | Admitting: Emergency Medicine

## 2021-01-02 ENCOUNTER — Other Ambulatory Visit: Payer: Self-pay

## 2021-01-02 DIAGNOSIS — M545 Low back pain, unspecified: Secondary | ICD-10-CM | POA: Insufficient documentation

## 2021-01-02 DIAGNOSIS — F17210 Nicotine dependence, cigarettes, uncomplicated: Secondary | ICD-10-CM | POA: Insufficient documentation

## 2021-01-02 DIAGNOSIS — U071 COVID-19: Secondary | ICD-10-CM | POA: Insufficient documentation

## 2021-01-02 LAB — URINALYSIS, ROUTINE W REFLEX MICROSCOPIC
Bilirubin Urine: NEGATIVE
Glucose, UA: NEGATIVE mg/dL
Hgb urine dipstick: NEGATIVE
Ketones, ur: 20 mg/dL — AB
Leukocytes,Ua: NEGATIVE
Nitrite: NEGATIVE
Protein, ur: 30 mg/dL — AB
Specific Gravity, Urine: 1.027 (ref 1.005–1.030)
pH: 5 (ref 5.0–8.0)

## 2021-01-02 MED ORDER — ACETAMINOPHEN 500 MG PO TABS
1000.0000 mg | ORAL_TABLET | Freq: Once | ORAL | Status: AC
  Administered 2021-01-02: 1000 mg via ORAL
  Filled 2021-01-02: qty 2

## 2021-01-02 NOTE — ED Provider Notes (Signed)
Emergency Medicine Provider Triage Evaluation Note  Pamela Bridges , a 30 y.o. female  was evaluated in triage.  Pt complains of generalized body aches, fever, and headache.  Onset this morning.  Denies dysuria or hematuria.  Has had some flank pain.  Review of Systems  Positive: Fever, headache, body aches Negative: N/v/d  Physical Exam  BP 122/84 (BP Location: Right Arm)   Pulse 90   Temp 99.3 F (37.4 C) (Oral)   Resp 18   Ht 5\' 6"  (1.676 m)   SpO2 99%   BMI 24.20 kg/m  Gen:   Awake, no distress   Resp:  Normal effort  MSK:   Moves extremities without difficulty  Other:    Medical Decision Making  Medically screening exam initiated at 10:14 PM.  Appropriate orders placed.  Pamela Bridges was informed that the remainder of the evaluation will be completed by another provider, this initial triage assessment does not replace that evaluation, and the importance of remaining in the ED until their evaluation is complete.  ?flu   [name], PA-C 01/02/21 2216    2217, MD 01/05/21 [phone]

## 2021-01-02 NOTE — ED Triage Notes (Signed)
Pt arrives EMS from home with c/o suspected fevers, bilateral flank pain and a mild nonproductive cough beginning this morning around 0700.

## 2021-01-02 NOTE — ED Triage Notes (Signed)
Pt reports bilateral flank pain that started at a7am today.  Pt states she has had fever, body aches, leg pain and headache

## 2021-01-03 LAB — RESP PANEL BY RT-PCR (FLU A&B, COVID) ARPGX2
Influenza A by PCR: NEGATIVE
Influenza B by PCR: NEGATIVE
SARS Coronavirus 2 by RT PCR: POSITIVE — AB

## 2021-01-03 MED ORDER — BENZONATATE 100 MG PO CAPS: 100.0000 mg | ORAL_CAPSULE | Freq: Two times a day (BID) | ORAL | 0 refills | Status: AC | PRN

## 2021-01-03 NOTE — ED Provider Notes (Signed)
[facility] EMERGENCY DEPARTMENT Provider Note   CSN: [id number] Arrival date & time: 01/02/21  1943     History Chief Complaint  Patient presents with   Flank Pain   Back Pain    Pamela Bridges is a 30 y.o. female.  Patient here with generalized body aches, fever, and headache.  Onset yesterday morning.  She also reports some pain in her low back, but denies any urinary symptoms.  She denies any sick contacts.  Denies any treatments prior to arrival.  Denies any other associated symptoms.  The history is provided by the patient. No language interpreter was used.      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   Marijuana smoker 02/07/2011   Consultation for female sterilization 02/07/2011   Hx of preterm delivery, currently pregnant 01/21/2011   Tobacco smoking complicating pregnancy 01/21/2011   Preterm contractions 01/21/2011   Supervision of high-risk pregnancy 01/16/2011   Abnormal MSAFP (maternal serum alpha-fetoprotein), elevated 10/24/2010    Past Surgical History:  Procedure Laterality Date   TUBAL LIGATION  03/01/2011   Procedure: POST PARTUM TUBAL LIGATION;  Surgeon: Tereso Newcomer, MD;  Location: [facility];  Service: Gynecology;  Laterality: Bilateral;   WISDOM TOOTH EXTRACTION  2005     OB History     Gravida  3   Para  3   Term  2   Preterm  1   AB      Living  3      SAB      IAB      Ectopic      Multiple      Live Births  3           Family History  Problem Relation Age of Onset   Learning disabilities Daughter    Anesthesia problems Neg Hx     Social History   Tobacco Use   Smoking status: Every Day     Packs/day: 1.00    Years: 13.00    Pack years: 13.00    Types: Cigarettes   Smokeless tobacco: Never  Substance Use Topics   Alcohol use: No    Comment: pt denies any other drug use other than THC    Drug use: Yes    Frequency: 7.0 times per week    Types: Cocaine, "Crack" cocaine, Marijuana    Comment: Uses marijuana- a blunt every 2-3 days.. Denies cocaine use during pregnancy.  Last marijuana 02/09/2011    Home Medications Prior to Admission medications   Medication Sig Start Date End Date Taking? Authorizing Provider  benzonatate (TESSALON) 100 MG capsule Take 1 capsule (100 mg total) by mouth 2 (two) times daily as needed for cough. 01/03/21  Yes Roxy Horseman, PA-C  celecoxib (CELEBREX) 200 MG capsule Take 1 capsule (200 mg total) by mouth 2 (two) times daily. 10/29/19   Arthor Captain, PA-C  methylPREDNISolone (MEDROL DOSEPAK) 4 MG TBPK tablet Use as directed 10/29/19   Arthor Captain, PA-C  metroNIDAZOLE (FLAGYL) 500 MG tablet Take 1 tablet (500 mg total) by mouth 2 (two) times  daily. 05/26/18   Henderly, Britni A, PA-C  ondansetron (ZOFRAN ODT) 4 MG disintegrating tablet Take 1 tablet (4 mg total) by mouth every 8 (eight) hours as needed for nausea or vomiting. 05/26/18   Henderly, Britni A, PA-C    Allergies    Eggs or egg-derived products  Review of Systems   Review of Systems  All other systems reviewed and are negative.  Physical Exam Updated Vital Signs BP 110/61 (BP Location: Right Arm)   Pulse 76   Temp 99.3 F (37.4 C) (Oral)   Resp 16   Ht 5\' 6"  (1.676 m)   SpO2 100%   BMI 24.20 kg/m   Physical Exam Vitals and nursing note reviewed.  Constitutional:      General: She is not in acute distress.    Appearance: She is well-developed.  HENT:     Head: Normocephalic and atraumatic.  Eyes:     Conjunctiva/sclera: Conjunctivae normal.  Cardiovascular:     Rate and Rhythm: Normal rate and regular rhythm.     Heart sounds: No murmur heard. Pulmonary:      Effort: Pulmonary effort is normal. No respiratory distress.     Breath sounds: Normal breath sounds.  Abdominal:     Palpations: Abdomen is soft.     Tenderness: There is no abdominal tenderness.  Musculoskeletal:        General: Normal range of motion.     Cervical back: Neck supple.  Skin:    General: Skin is warm and dry.  Neurological:     Mental Status: She is alert and oriented to person, place, and time.  Psychiatric:        Mood and Affect: Mood normal.        Behavior: Behavior normal.    ED Results / Procedures / Treatments   Labs (all labs ordered are listed, but only abnormal results are displayed) Labs Reviewed  RESP PANEL BY RT-PCR (FLU A&B, COVID) ARPGX2 - Abnormal; Notable for the following components:      Result Value   SARS Coronavirus 2 by RT PCR POSITIVE (*)    All other components within normal limits  URINALYSIS, ROUTINE W REFLEX MICROSCOPIC - Abnormal; Notable for the following components:   Color, Urine AMBER (*)    APPearance HAZY (*)    Ketones, ur 20 (*)    Protein, ur 30 (*)    Bacteria, UA RARE (*)    All other components within normal limits    EKG None  Radiology No results found.  Procedures Procedures   Medications Ordered in ED Medications  acetaminophen (TYLENOL) tablet 1,000 mg (1,000 mg Oral Given 01/02/21 2216)    ED Course  I have reviewed the triage vital signs and the nursing notes.  Pertinent labs & imaging results that were available during my care of the patient were reviewed by me and considered in my medical decision making (see chart for details).    MDM Rules/Calculators/A&P                           Patient here with COVID-like symptoms.  COVID test was positive.  Low risk, do not feel the antiviral treatment is needed.  Patient understands agrees with the plan. Final Clinical Impression(s) / ED Diagnoses Final diagnoses:  [id number]    Rx / DC Orders ED Discharge Orders          Ordered    benzonatate  (TESSALON) 100  MG capsule  2 times daily PRN        01/03/21 0618             Montine Circle, PA-C 01/03/21 [id number]    Palumbo, April, MD 01/03/21 [phone]

## 2021-01-03 NOTE — Discharge Instructions (Signed)
   You have tested positive for COVID-19, meaning that you were infected with the novel coronavirus and could give the virus to others.  It is vitally important that you stay home so you do not spread it to others.      Please continue isolation at home, for at least 10 days since the start of your symptoms and until you have had 24 hours with no fever (without taking a fever reducer) and with improving of symptoms.  If you have no symptoms but tested positive (or all symptoms resolve after 5 days and you have no fever) you can leave your house but continue to wear a mask around others for an additional 5 days. If you have a fever,continue to stay home until you have had 24 hours of no fever. Most cases improve 5-10 days from onset but we have seen a small number of patients who have gotten worse after the 10 days.  Please be sure to watch for worsening symptoms and remain taking the proper precautions.   Go to the nearest hospital ED for assessment if fever/cough/breathlessness are severe or illness seems like a threat to life.    The following symptoms may appear 2-14 days after exposure: Fever Cough Shortness of breath or difficulty breathing Chills Repeated shaking with chills Muscle pain Headache Sore throat New loss of taste or smell Fatigue Congestion or runny nose Nausea or vomiting Diarrhea  You may also take acetaminophen (Tylenol) as needed for fever.  HOME CARE: Only take medications as instructed by your medical team. Drink plenty of fluids and get plenty of rest. A steam or ultrasonic humidifier can help if you have congestion.   GET HELP RIGHT AWAY IF YOU HAVE EMERGENCY WARNING SIGNS.  Call 911 or proceed to your closest emergency facility if: You develop worsening high fever. Trouble breathing Bluish lips or face Persistent pain or pressure in the chest New confusion Inability to wake or stay awake You cough up blood. Your symptoms become more severe Inability  to hold down food or fluids

## 2021-01-03 NOTE — ED Notes (Signed)
Patient called x3 to move to appropriate seating area with no results

## 2021-02-27 ENCOUNTER — Emergency Department (HOSPITAL_COMMUNITY)
Admission: EM | Admit: 2021-02-27 | Discharge: 2021-02-27 | Disposition: A | Discharge status: Home / Self Care | Payer: Self-pay | Attending: Emergency Medicine | Admitting: Emergency Medicine

## 2021-02-27 ENCOUNTER — Other Ambulatory Visit: Payer: Self-pay

## 2021-02-27 DIAGNOSIS — T192XXA Foreign body in vulva and vagina, initial encounter: Secondary | ICD-10-CM | POA: Insufficient documentation

## 2021-02-27 DIAGNOSIS — R350 Frequency of micturition: Secondary | ICD-10-CM | POA: Insufficient documentation

## 2021-02-27 DIAGNOSIS — X58XXXA Exposure to other specified factors, initial encounter: Secondary | ICD-10-CM | POA: Insufficient documentation

## 2021-02-27 DIAGNOSIS — Z5321 Procedure and treatment not carried out due to patient leaving prior to being seen by health care provider: Secondary | ICD-10-CM | POA: Insufficient documentation

## 2021-02-27 NOTE — ED Triage Notes (Signed)
Pt via EMS for tampon stuck in vagina since yesterday am. Cannot see the string and cannot feel it. Had sex with it in and "forgot it was there." Denies abdominal pain, n/v.

## 2021-02-27 NOTE — ED Provider Notes (Signed)
Emergency Medicine Provider Triage Evaluation Note  Pamela Bridges , a 30 y.o. female  was evaluated in triage.  Pt complains of tampon stuck in her vaginal vault since yesterday.  States she is on her menstrual cycle and had a tampon in place when she had intercourse and has since been unable to remove it.  Some vaginal pain.  Continues have vaginal bleeding consistent with her period.  She denies any fevers, chills, nausea, vomiting, dysuria.  Endorses urinary frequency.  History of tubal ligation per patient.  Review of Systems  Positive: Vaginal foreign body, urinary frequency Negative: The fevers, chills, nausea, vomit  Physical Exam  BP 109/67 (BP Location: Left Arm)    Pulse 85    Temp 98.3 F (36.8 C) (Oral)    Resp 16    SpO2 98%  Gen:   Awake, no distress   Resp:  Normal effort  MSK:   Moves extremities without difficulty  Other:  Abdomen soft, nondistended, nontender  Medical Decision Making  Medically screening exam initiated at 1:42 PM.  Appropriate orders placed.  Pamela Bridges was informed that the remainder of the evaluation will be completed by another provider, this initial triage assessment does not replace that evaluation, and the importance of remaining in the ED until their evaluation is complete.  This chart was dictated using voice recognition software, Dragon. Despite the best efforts of this provider to proofread and correct errors, errors may still occur which can change documentation meaning.    Sherrilee Gilles 02/27/21 1346    Pollyann Savoy, MD 02/27/21 [phone]

## 2021-02-27 NOTE — ED Notes (Signed)
LWBS 

## 2021-11-25 ENCOUNTER — Emergency Department (HOSPITAL_COMMUNITY)
Admission: EM | Admit: 2021-11-25 | Discharge: 2021-11-26 | Discharge status: Against Medical Advice | Payer: Self-pay | Attending: Emergency Medicine | Admitting: Emergency Medicine

## 2021-11-25 ENCOUNTER — Emergency Department (HOSPITAL_COMMUNITY): Payer: Self-pay

## 2021-11-25 ENCOUNTER — Other Ambulatory Visit: Payer: Self-pay

## 2021-11-25 DIAGNOSIS — Z5321 Procedure and treatment not carried out due to patient leaving prior to being seen by health care provider: Secondary | ICD-10-CM | POA: Insufficient documentation

## 2021-11-25 DIAGNOSIS — X58XXXA Exposure to other specified factors, initial encounter: Secondary | ICD-10-CM | POA: Insufficient documentation

## 2021-11-25 DIAGNOSIS — S91301A Unspecified open wound, right foot, initial encounter: Secondary | ICD-10-CM | POA: Insufficient documentation

## 2021-11-25 LAB — CBC WITH DIFFERENTIAL/PLATELET
Abs Immature Granulocytes: 0.06 10*3/uL (ref 0.00–0.07)
Basophils Absolute: 0.1 10*3/uL (ref 0.0–0.1)
Basophils Relative: 1 %
Eosinophils Absolute: 0.4 10*3/uL (ref 0.0–0.5)
Eosinophils Relative: 4 %
HCT: 45.5 % (ref 36.0–46.0)
Hemoglobin: 14.5 g/dL (ref 12.0–15.0)
Immature Granulocytes: 1 %
Lymphocytes Relative: 26 %
Lymphs Abs: 2.9 10*3/uL (ref 0.7–4.0)
MCH: 29.1 pg (ref 26.0–34.0)
MCHC: 31.9 g/dL (ref 30.0–36.0)
MCV: 91.2 fL (ref 80.0–100.0)
Monocytes Absolute: 0.6 10*3/uL (ref 0.1–1.0)
Monocytes Relative: 6 %
Neutro Abs: 7.1 10*3/uL (ref 1.7–7.7)
Neutrophils Relative %: 62 %
Platelets: 387 10*3/uL (ref 150–400)
RBC: 4.99 MIL/uL (ref 3.87–5.11)
RDW: 13.6 % (ref 11.5–15.5)
WBC: 11.1 10*3/uL — ABNORMAL HIGH (ref 4.0–10.5)
nRBC: 0 % (ref 0.0–0.2)

## 2021-11-25 LAB — COMPREHENSIVE METABOLIC PANEL
ALT: 12 U/L (ref 0–44)
AST: 21 U/L (ref 15–41)
Albumin: 3.5 g/dL (ref 3.5–5.0)
Alkaline Phosphatase: 65 U/L (ref 38–126)
Anion gap: 5 (ref 5–15)
BUN: 11 mg/dL (ref 6–20)
CO2: 23 mmol/L (ref 22–32)
Calcium: 8.7 mg/dL — ABNORMAL LOW (ref 8.9–10.3)
Chloride: 109 mmol/L (ref 98–111)
Creatinine, Ser: 0.77 mg/dL (ref 0.44–1.00)
GFR, Estimated: >60 mL/min (ref >60)
Glucose, Bld: 100 mg/dL — ABNORMAL HIGH (ref 70–99)
Potassium: 4.7 mmol/L (ref 3.5–5.1)
Sodium: 137 mmol/L (ref 135–145)
Total Bilirubin: 0.2 mg/dL — ABNORMAL LOW (ref 0.3–1.2)
Total Protein: 5.9 g/dL — ABNORMAL LOW (ref 6.5–8.1)

## 2021-11-25 LAB — I-STAT BETA HCG BLOOD, ED (MC, WL, AP ONLY): I-stat hCG, quantitative: <5 m[IU]/mL (ref <5)

## 2021-11-25 NOTE — ED Triage Notes (Addendum)
Patient BIB [facility] from home w/ wound to R foot in between the 4th and 5th digit. This has been ongoing for a month now. Patient unsure where the wound came from. Patient unable to bear weight on foot due to discomfort. Patient denies any drainage. Hard to visualize entire wound in triage.  BP-126/90 HR-84 Spo2-98

## 2021-11-25 NOTE — ED Notes (Signed)
Pt refused vitals 

## 2021-11-26 NOTE — ED Notes (Signed)
Pt called for X3 to go to a room. Pt could not be found.

## 2021-12-15 ENCOUNTER — Other Ambulatory Visit: Payer: Self-pay

## 2021-12-15 ENCOUNTER — Emergency Department (HOSPITAL_COMMUNITY)
Admission: EM | Admit: 2021-12-15 | Discharge: 2021-12-15 | Disposition: A | Discharge status: Home / Self Care | Payer: Self-pay | Attending: Emergency Medicine | Admitting: Emergency Medicine

## 2021-12-15 ENCOUNTER — Encounter (HOSPITAL_COMMUNITY): Payer: Self-pay | Admitting: Emergency Medicine

## 2021-12-15 DIAGNOSIS — B353 Tinea pedis: Secondary | ICD-10-CM | POA: Insufficient documentation

## 2021-12-15 DIAGNOSIS — I10 Essential (primary) hypertension: Secondary | ICD-10-CM | POA: Insufficient documentation

## 2021-12-15 MED ORDER — KETOCONAZOLE 2 % EX CREA: 1.0000 | TOPICAL_CREAM | Freq: Every day | CUTANEOUS | 0 refills | Status: AC

## 2021-12-15 MED ORDER — CEPHALEXIN 500 MG PO CAPS: ORAL_CAPSULE | ORAL | 0 refills | Status: DC

## 2021-12-15 NOTE — Discharge Instructions (Signed)
General instructions Do not let others use towels, shoes, nail clippers, or other personal items that touch your feet. Protect your feet by wearing sandals in wet areas, such as locker rooms and shared showers. Keep all follow-up visits. This is important. If you have diabetes, keep your blood sugar under control. Contact a health care provider if: You have a fever. You have swelling, soreness, warmth, or redness in your foot. Your feet are not getting better with treatment. Your symptoms get worse. You have new symptoms. You have severe pain.

## 2021-12-15 NOTE — ED Triage Notes (Signed)
BIBA Per EMS: Pt coming from home w/ c/o right foot pain. Pt ambulatory w/ cane. Denies injury to foot. Left AMA @ [facility] for same  VSS

## 2021-12-15 NOTE — ED Provider Notes (Signed)
[facility] DEPT Provider Note   CSN: [id number] Arrival date & time: 12/15/21  0907     History  Chief Complaint  Patient presents with   Foot Pain    Pamela Bridges is a 31 y.o. female presents with foot pain. Pain is b/n digits 4/5 on the R. Has crusting between the toes for years, R> L / started developing pain 2 weeks ago. She has a fissure in the crusting. Pain is worse with ambulation, better with rest. Denies fever. No meds at home. No injuries. Pain is constant, non radiating, moderate.   Foot Pain       Home Medications Prior to Admission medications   Medication Sig Start Date End Date Taking? Authorizing Provider  benzonatate (TESSALON) 100 MG capsule Take 1 capsule (100 mg total) by mouth 2 (two) times daily as needed for cough. 01/03/21   Montine Circle, PA-C  celecoxib (CELEBREX) 200 MG capsule Take 1 capsule (200 mg total) by mouth 2 (two) times daily. 10/29/19   Margarita Mail, PA-C  methylPREDNISolone (MEDROL DOSEPAK) 4 MG TBPK tablet Use as directed 10/29/19   Margarita Mail, PA-C  metroNIDAZOLE (FLAGYL) 500 MG tablet Take 1 tablet (500 mg total) by mouth 2 (two) times daily. 05/26/18   Henderly, Britni A, PA-C  ondansetron (ZOFRAN ODT) 4 MG disintegrating tablet Take 1 tablet (4 mg total) by mouth every 8 (eight) hours as needed for nausea or vomiting. 05/26/18   Henderly, Britni A, PA-C      Allergies    Eggs or egg-derived products    Review of Systems   Review of Systems  Constitutional:  Negative for fever.  Skin:  Positive for wound.    Physical Exam Updated Vital Signs BP (!) 136/117 (BP Location: Left Arm)   Pulse 79   Temp 97.7 F (36.5 C) (Oral)   Resp 15   SpO2 100%  Physical Exam Vitals and nursing note reviewed.  Constitutional:      General: She is not in acute distress.    Appearance: She is well-developed. She is not diaphoretic.  HENT:     Head: Normocephalic and atraumatic.      Right Ear: External ear normal.     Left Ear: External ear normal.     Nose: Nose normal.     Mouth/Throat:     Mouth: Mucous membranes are moist.  Eyes:     General: No scleral icterus.    Conjunctiva/sclera: Conjunctivae normal.  Cardiovascular:     Rate and Rhythm: Normal rate and regular rhythm.     Heart sounds: Normal heart sounds. No murmur heard.    No friction rub. No gallop.  Pulmonary:     Effort: Pulmonary effort is normal. No respiratory distress.     Breath sounds: Normal breath sounds.  Abdominal:     General: Bowel sounds are normal. There is no distension.     Palpations: Abdomen is soft. There is no mass.     Tenderness: There is no abdominal tenderness. There is no guarding.  Musculoskeletal:     Cervical back: Normal range of motion.  Skin:    General: Skin is warm and dry.     Comments: Bilateral feet with peeling in a moccasin distribution.  She has dry crusting which is thickened significantly on the right foot between the fourth and fifth digits..  There is a fissure present.  No obvious signs of cellulitis.  Neurological:     Mental Status: She  is alert and oriented to person, place, and time.  Psychiatric:        Behavior: Behavior normal.     ED Results / Procedures / Treatments   Labs (all labs ordered are listed, but only abnormal results are displayed) Labs Reviewed - No data to display  EKG None  Radiology No results found.  Procedures Procedures    Medications Ordered in ED Medications - No data to display  ED Course/ Medical Decision Making/ A&P                           Medical Decision Making 31 year old female with bilateral tinea pedis.  She has significant thickening and a fissure between the fourth and fifth digits.  Patient advised to follow closely with a podiatrist given the extensive fungal infection on both feet.  She does not have any obvious signs of cellulitis but will cover with Keflex.  Ketoconazole to be applied  between the toes and all over her feet.  Discussed outpatient follow-up and return precautions. Patient noted be hypertensive.  Advised follow-up with primary care doctor           Final Clinical Impression(s) / ED Diagnoses Final diagnoses:  Tinea pedis of both feet    Rx / DC Orders ED Discharge Orders     None         Margarita Mail, PA-C 12/15/21 1740    Ezequiel Essex, MD 12/15/21 1305

## 2021-12-27 ENCOUNTER — Ambulatory Visit: Payer: Self-pay | Admitting: Podiatry

## 2022-01-07 ENCOUNTER — Ambulatory Visit: Payer: Self-pay | Admitting: Podiatry

## 2023-08-19 ENCOUNTER — Emergency Department (HOSPITAL_COMMUNITY)
Admission: EM | Admit: 2023-08-19 | Discharge: 2023-08-19 | Disposition: A | Discharge status: Home / Self Care | Payer: Self-pay | Attending: Emergency Medicine | Admitting: Emergency Medicine

## 2023-08-19 ENCOUNTER — Emergency Department (HOSPITAL_COMMUNITY): Payer: Self-pay

## 2023-08-19 ENCOUNTER — Other Ambulatory Visit: Payer: Self-pay

## 2023-08-19 DIAGNOSIS — F17200 Nicotine dependence, unspecified, uncomplicated: Secondary | ICD-10-CM | POA: Insufficient documentation

## 2023-08-19 DIAGNOSIS — W25XXXA Contact with sharp glass, initial encounter: Secondary | ICD-10-CM | POA: Insufficient documentation

## 2023-08-19 DIAGNOSIS — Y93F2 Activity, caregiving, lifting: Secondary | ICD-10-CM | POA: Insufficient documentation

## 2023-08-19 DIAGNOSIS — S61411A Laceration without foreign body of right hand, initial encounter: Secondary | ICD-10-CM | POA: Insufficient documentation

## 2023-08-19 MED ORDER — BACITRACIN ZINC 500 UNIT/GM EX OINT
TOPICAL_OINTMENT | Freq: Once | CUTANEOUS | Status: AC
  Filled 2023-08-19: qty 0.9

## 2023-08-19 MED ORDER — CEPHALEXIN 500 MG PO CAPS: 500.0000 mg | ORAL_CAPSULE | Freq: Two times a day (BID) | ORAL | 0 refills | Status: AC

## 2023-08-19 MED ORDER — LIDOCAINE HCL (PF) 1 % IJ SOLN
30.0000 mL | Freq: Once | INTRAMUSCULAR | Status: AC
  Administered 2023-08-19: 30 mL
  Filled 2023-08-19: qty 30

## 2023-08-19 MED ORDER — OXYCODONE-ACETAMINOPHEN 5-325 MG PO TABS
1.0000 | ORAL_TABLET | Freq: Once | ORAL | Status: AC
  Administered 2023-08-19: 1 via ORAL
  Filled 2023-08-19: qty 1

## 2023-08-19 NOTE — ED Provider Notes (Signed)
 [place] EMERGENCY DEPARTMENT AT [facility] Provider Note   CSN: [id number] Arrival date & time: 08/19/23  1104     Patient presents with: Laceration (Pt was lifting window open this morning and cut her R thumb on glass. EMS reports pt stated squirting blood. Fire arrived and bleeding controlled, good cap refill 3 inch lac, 126/88, HR82, RR16, 96%RA no diabetic )   Pamela Bridges is a 33 y.o. female.   33 year old female presenting with laceration to right hand.  Patient was opening a window earlier this morning when the glass somehow broke and cut the base of her right thumb, she was unable to see if any glass was left in the thumb.  She describes pain and numbness of the right thumb but is able to wiggle it. Last tetanus unknown.  She is not on blood thinners.   Laceration      Prior to Admission medications   Medication Sig Start Date End Date Taking? Authorizing Provider  benzonatate  (TESSALON ) 100 MG capsule Take 1 capsule (100 mg total) by mouth 2 (two) times daily as needed for cough. 01/03/21   Sherel Dikes, PA-C  celecoxib  (CELEBREX ) 200 MG capsule Take 1 capsule (200 mg total) by mouth 2 (two) times daily. 10/29/19   Harris, Abigail, PA-C  cephALEXin  (KEFLEX ) 500 MG capsule 2 caps po bid x 7 days 12/15/21   Harris, Abigail, PA-C  ketoconazole  (NIZORAL ) 2 % cream Apply 1 Application topically daily. 12/15/21   Harris, Abigail, PA-C  methylPREDNISolone  (MEDROL  DOSEPAK) 4 MG TBPK tablet Use as directed 10/29/19   Harris, Abigail, PA-C  metroNIDAZOLE  (FLAGYL ) 500 MG tablet Take 1 tablet (500 mg total) by mouth 2 (two) times daily. 05/26/18   Henderly, Britni A, PA-C  ondansetron  (ZOFRAN  ODT) 4 MG disintegrating tablet Take 1 tablet (4 mg total) by mouth every 8 (eight) hours as needed for nausea or vomiting. 05/26/18   Henderly, Britni A, PA-C    Allergies: Egg-derived products    Review of Systems  Updated Vital Signs BP (!) 117/91 (BP Location: Left  Arm)   Pulse 85   Temp 98.1 F (36.7 C) (Oral)   Resp 16   SpO2 100%   Physical Exam Vitals and nursing note reviewed.  HENT:     Head: Normocephalic.   Eyes:     Extraocular Movements: Extraocular movements intact.    Cardiovascular:     Rate and Rhythm: Normal rate.  Pulmonary:     Effort: Pulmonary effort is normal.   Musculoskeletal:     Comments: Full ROM of R thumb, pain at site of laceration with movement Good capillary refill of R thumb, 2+ radial pulse   Skin:    Comments: 2-3cm laceration to base of R thumb, see photo   Neurological:     Mental Status: She is alert.     Comments: Patient notes numbness to R thumb     (all labs ordered are listed, but only abnormal results are displayed) Labs Reviewed - No data to display  EKG: None  Radiology: No results found.   .Laceration Repair  Date/Time: 08/19/2023 2:20 PM  Performed by: Kendrick Pax, PA-C Authorized by: Kendrick Pax, PA-C   Consent:    Consent obtained:  Verbal   Consent given by:  Patient   Risks, benefits, and alternatives were discussed: yes     Risks discussed:  Infection, pain, retained foreign body, tendon damage, vascular damage, poor cosmetic result, poor wound healing, nerve  damage and need for additional repair   Alternatives discussed:  No treatment Universal protocol:    Procedure explained and questions answered to patient or proxy's satisfaction: yes     Patient identity confirmed:  Verbally with patient Anesthesia:    Anesthesia method:  Local infiltration   Local anesthetic:  Lidocaine  1% w/o epi Laceration details:    Location:  Hand   Hand location:  R palm   Length (cm):  3 Pre-procedure details:    Preparation:  Imaging obtained to evaluate for foreign bodies Exploration:    Hemostasis achieved with:  Direct pressure   Imaging obtained: x-ray     Imaging outcome: foreign body not noted     Wound exploration: wound explored through full range of motion and  entire depth of wound visualized   Treatment:    Area cleansed with:  Saline and povidone-iodine   Amount of cleaning:  Standard   Irrigation solution:  Sterile saline   Irrigation method:  Syringe   Visualized foreign bodies/material removed: no   Skin repair:    Repair method:  Sutures   Suture size:  5-0   Suture material:  Prolene   Suture technique:  Simple interrupted   Number of sutures:  5 Approximation:    Approximation:  Close Repair type:    Repair type:  Simple Post-procedure details:    Dressing:  Antibiotic ointment and non-adherent dressing   Procedure completion:  Tolerated well, no immediate complications    Medications Ordered in the ED  lidocaine  (PF) (XYLOCAINE ) 1 % injection 30 mL (has no administration in time range)  oxyCODONE -acetaminophen  (PERCOCET/ROXICET) 5-325 MG per tablet 1 tablet (1 tablet Oral Given 08/19/23 1254)                                    Medical Decision Making This patient presents to the ED for concern of laceration, this involves an extensive number of treatment options, and is a complaint that carries with it a high risk of complications and morbidity.  The differential diagnosis includes laceration, laceration with foreign body, laceration with bony involvement.    Additional history obtained:  Additional history obtained from record review External records from outside source obtained and reviewed including prior ED notes, TDAP history   Imaging Studies ordered:  I ordered imaging studies including XR imaging R hand  I independently visualized and interpreted imaging which showed No acute fracture or dislocation. No radiopaque foreign body.  I agree with the radiologist interpretation   Problem List / ED Course / Critical interventions / Medication management   I ordered medication including percocet  for pain  Reevaluation of the patient after these medicines showed that the patient improved I have reviewed the  patients home medicines and have made adjustments as needed   Social Determinants of Health:  Tobacco use, no PCP   Test / Admission - Considered:  Physical exam notable for laceration at base of thumb to palmar aspect of right hand, see physical exam details in photo above for further detailed report.  Upon chart review last TDAP was administered < 6 years ago, will not repeat today. XR imaging reassuring as above.  Laceration repair completed without complication, see procedure note above for further details.  Discussed with patient that sutures should be removed in 7 to 10 days, she voiced understanding.  Patient does not have a primary care provider, I will  provide her with the contact information for [facility], she can schedule follow-up with them or establish care she does not have a primary care provider.  Will prescribe 7-day course of Keflex .  She is appropriate for discharge at this time.    Amount and/or Complexity of Data Reviewed Radiology: ordered.  Risk OTC drugs. Prescription drug management.        Final diagnoses:  Laceration of right hand without foreign body, initial encounter    ED Discharge Orders          Ordered    cephALEXin  (KEFLEX ) 500 MG capsule  2 times daily        08/19/23 [address], [place] 08/19/23 1430    Ninetta Basket, MD 08/19/23 1727

## 2023-08-19 NOTE — Discharge Instructions (Addendum)
 Your sutures will need to be removed in 7 to 10 days, this can be done at urgent care or the emergency department.  I have provided you with the contact information for [facility], you can call this number to schedule follow-up since you do not have a primary care provider.  Return to the emergency department if your symptoms worsen. Start Keflex , one tablet twice daily for 7 days.

## 2023-10-24 ENCOUNTER — Other Ambulatory Visit: Payer: Self-pay

## 2023-10-24 ENCOUNTER — Emergency Department (HOSPITAL_COMMUNITY)
Admission: EM | Admit: 2023-10-24 | Discharge: 2023-10-24 | Disposition: A | Discharge status: Home / Self Care | Payer: Self-pay | Attending: Emergency Medicine | Admitting: Emergency Medicine

## 2023-10-24 ENCOUNTER — Emergency Department (HOSPITAL_COMMUNITY): Payer: Self-pay

## 2023-10-24 DIAGNOSIS — S0081XA Abrasion of other part of head, initial encounter: Secondary | ICD-10-CM | POA: Insufficient documentation

## 2023-10-24 DIAGNOSIS — R569 Unspecified convulsions: Secondary | ICD-10-CM | POA: Insufficient documentation

## 2023-10-24 DIAGNOSIS — W06XXXA Fall from bed, initial encounter: Secondary | ICD-10-CM | POA: Insufficient documentation

## 2023-10-24 LAB — CBC
HCT: 43.2 % (ref 36.0–46.0)
Hemoglobin: 13.8 g/dL (ref 12.0–15.0)
MCH: 28.7 pg (ref 26.0–34.0)
MCHC: 31.9 g/dL (ref 30.0–36.0)
MCV: 89.8 fL (ref 80.0–100.0)
Platelets: 318 K/uL (ref 150–400)
RBC: 4.81 MIL/uL (ref 3.87–5.11)
RDW: 12.4 % (ref 11.5–15.5)
WBC: 8.5 K/uL (ref 4.0–10.5)
nRBC: 0 % (ref 0.0–0.2)

## 2023-10-24 LAB — COMPREHENSIVE METABOLIC PANEL WITH GFR
ALT: 17 U/L (ref 0–44)
AST: 24 U/L (ref 15–41)
Albumin: 4 g/dL (ref 3.5–5.0)
Alkaline Phosphatase: 64 U/L (ref 38–126)
Anion gap: 10 (ref 5–15)
BUN: 8 mg/dL (ref 6–20)
CO2: 21 mmol/L — ABNORMAL LOW (ref 22–32)
Calcium: 9.2 mg/dL (ref 8.9–10.3)
Chloride: 106 mmol/L (ref 98–111)
Creatinine, Ser: 0.87 mg/dL (ref 0.44–1.00)
GFR, Estimated: >60 mL/min (ref >60)
Glucose, Bld: 91 mg/dL (ref 70–99)
Potassium: 3.8 mmol/L (ref 3.5–5.1)
Sodium: 137 mmol/L (ref 135–145)
Total Bilirubin: 0.5 mg/dL (ref 0.0–1.2)
Total Protein: 6.6 g/dL (ref 6.5–8.1)

## 2023-10-24 LAB — CBG MONITORING, ED
Glucose-Capillary: 50 mg/dL — ABNORMAL LOW (ref 70–99)
Glucose-Capillary: 97 mg/dL (ref 70–99)

## 2023-10-24 LAB — HCG, SERUM, QUALITATIVE: Preg, Serum: NEGATIVE

## 2023-10-24 LAB — HEPATIC FUNCTION PANEL
ALT: 17 U/L (ref 0–44)
AST: 23 U/L (ref 15–41)
Albumin: 4.1 g/dL (ref 3.5–5.0)
Alkaline Phosphatase: 63 U/L (ref 38–126)
Bilirubin, Direct: 0.1 mg/dL (ref 0.0–0.2)
Indirect Bilirubin: 0.5 mg/dL (ref 0.3–0.9)
Total Bilirubin: 0.6 mg/dL (ref 0.0–1.2)
Total Protein: 6.7 g/dL (ref 6.5–8.1)

## 2023-10-24 LAB — RAPID URINE DRUG SCREEN, HOSP PERFORMED
Amphetamines: NOT DETECTED
Barbiturates: NOT DETECTED
Benzodiazepines: NOT DETECTED
Cocaine: POSITIVE — AB
Opiates: NOT DETECTED
Tetrahydrocannabinol: POSITIVE — AB

## 2023-10-24 LAB — PREGNANCY, URINE: Preg Test, Ur: NEGATIVE

## 2023-10-24 MED ORDER — LEVETIRACETAM 500 MG PO TABS: 500.0000 mg | ORAL_TABLET | Freq: Two times a day (BID) | ORAL | 0 refills | Status: AC

## 2023-10-24 MED ORDER — LEVETIRACETAM (KEPPRA) 500 MG/5 ML ADULT IV PUSH
1000.0000 mg | Freq: Once | INTRAVENOUS | Status: AC
  Administered 2023-10-24: 1000 mg via INTRAVENOUS
  Filled 2023-10-24: qty 10

## 2023-10-24 NOTE — ED Provider Notes (Signed)
 [place] EMERGENCY DEPARTMENT AT [facility] Provider Note   CSN: [id number] Arrival date & time: 10/24/23  9366     Patient presents with: Seizures   Pamela Bridges is a 33 y.o. female.   HPI 33 year old female presents with report of seizure.  Patient states she does not remember the seizure events.  She reports that she thinks she has had seizures multiple times over the past several months but has refused to come to the emergency department for this.  She states that she does not really know anything about it and to ask her girlfriend.   12:23 PM Discussed with GF who witness gmal seizure lasted 8-10 minutes.  Postictal and brought to ED. GF reports that she has had seizures 5 seizures, but has not been seen for this.   Prior to Admission medications   Medication Sig Start Date End Date Taking? Authorizing Provider  levETIRAcetam  (KEPPRA ) 500 MG tablet Take 1 tablet (500 mg total) by mouth 2 (two) times daily. 10/24/23  Yes Levander Houston, MD  benzonatate  (TESSALON ) 100 MG capsule Take 1 capsule (100 mg total) by mouth 2 (two) times daily as needed for cough. 01/03/21   Vicky Charleston, PA-C  celecoxib  (CELEBREX ) 200 MG capsule Take 1 capsule (200 mg total) by mouth 2 (two) times daily. 10/29/19   Harris, Abigail, PA-C  ketoconazole  (NIZORAL ) 2 % cream Apply 1 Application topically daily. 12/15/21   Harris, Abigail, PA-C  methylPREDNISolone  (MEDROL  DOSEPAK) 4 MG TBPK tablet Use as directed 10/29/19   Harris, Abigail, PA-C  metroNIDAZOLE  (FLAGYL ) 500 MG tablet Take 1 tablet (500 mg total) by mouth 2 (two) times daily. 05/26/18   Henderly, Britni A, PA-C  ondansetron  (ZOFRAN  ODT) 4 MG disintegrating tablet Take 1 tablet (4 mg total) by mouth every 8 (eight) hours as needed for nausea or vomiting. 05/26/18   Henderly, Britni A, PA-C    Allergies: Egg-derived products    Review of Systems  Updated Vital Signs BP (!) 137/94   Pulse 64   Temp 98.2 F (36.8 C)  (Oral)   Resp 14   Ht 1.676 m (5' 6)   Wt 62.6 kg   SpO2 100%   BMI 22.27 kg/m   Physical Exam Vitals and nursing note reviewed.  Constitutional:      Appearance: Normal appearance.  HENT:     Head: Normocephalic.     Comments: Abrasion to forehead and midface    Right Ear: External ear normal.     Left Ear: External ear normal.     Nose: Nose normal.  Cardiovascular:     Rate and Rhythm: Normal rate and regular rhythm.  Pulmonary:     Effort: Pulmonary effort is normal.     Breath sounds: Normal breath sounds.  Abdominal:     General: Abdomen is flat.     Palpations: Abdomen is soft.  Musculoskeletal:     Cervical back: Normal range of motion.     Comments: No point tenderness to cervical thoracic or lumbar spine  Skin:    General: Skin is warm and dry.  Neurological:     General: No focal deficit present.     Mental Status: She is alert and oriented to person, place, and time.  Psychiatric:        Attention and Perception: Attention normal.        Mood and Affect: Affect is labile and angry.        Behavior: Behavior is cooperative.     (  all labs ordered are listed, but only abnormal results are displayed) Labs Reviewed  COMPREHENSIVE METABOLIC PANEL WITH GFR - Abnormal; Notable for the following components:      Result Value   CO2 21 (*)    All other components within normal limits  RAPID URINE DRUG SCREEN, HOSP PERFORMED - Abnormal; Notable for the following components:   Cocaine POSITIVE (*)    Tetrahydrocannabinol POSITIVE (*)    All other components within normal limits  CBG MONITORING, ED - Abnormal; Notable for the following components:   Glucose-Capillary 50 (*)    All other components within normal limits  HCG, SERUM, QUALITATIVE  CBC  HEPATIC FUNCTION PANEL  PREGNANCY, URINE  CBG MONITORING, ED    EKG: None  Radiology: CT Head Wo Contrast Result Date: 10/24/2023 CLINICAL DATA:  Mental status change of unknown cause. Recent diagnosis of  seizure disorder. Recent seizure with fall. EXAM: CT HEAD WITHOUT CONTRAST TECHNIQUE: Contiguous axial images were obtained from the base of the skull through the vertex without intravenous contrast. RADIATION DOSE REDUCTION: This exam was performed according to the departmental dose-optimization program which includes automated exposure control, adjustment of the mA and/or kV according to patient size and/or use of iterative reconstruction technique. COMPARISON:  None Available. FINDINGS: Brain: No sign of acute stroke or hemorrhage. No focal brain lesion is seen. Patient has asymmetry of the lateral ventricular size with the right being larger than the left. This can be a normal variant but in the setting of seizure brain MRI would be recommended. I would assume that has been performed elsewhere based on the clinical history. If not, that would be recommended in a non emergent outpatient setting. Vascular: No abnormal vascular finding. Skull: Normal Sinuses/Orbits: Clear/normal Other: None IMPRESSION: No acute finding by CT. Asymmetry of the lateral ventricular size with the right being larger than the left. This can be a normal variant but in the setting of seizure, brain MRI would be recommended. I would assume that this has been performed elsewhere based on the clinical history. If not, that would be recommended in a non emergent outpatient setting. Electronically Signed   By: Oneil Officer M.D.   On: 10/24/2023 08:31     .Critical Care  Performed by: Levander Houston, MD Authorized by: Levander Houston, MD   Critical care provider statement:    Critical care time (minutes):  65   Critical care end time:  10/24/2023 12:22 PM   Critical care time was exclusive of:  Separately billable procedures and treating other patients and teaching time   Critical care was necessary to treat or prevent imminent or life-threatening deterioration of the following conditions:  CNS failure or compromise   Critical care was  time spent personally by me on the following activities:  Development of treatment plan with patient or surrogate, discussions with consultants, evaluation of patient's response to treatment, examination of patient, ordering and review of laboratory studies, ordering and review of radiographic studies, ordering and performing treatments and interventions, pulse oximetry, re-evaluation of patient's condition and review of old charts    Medications Ordered in the ED  levETIRAcetam  (KEPPRA ) undiluted injection 1,000 mg (1,000 mg Intravenous Given 10/24/23 1118)    Clinical Course as of 10/24/23 1223  Fri Oct 24, 2023  0924 Complete metabolic panel reviewed interpret except for CO2 decreased at 21 otherwise within normal limits Liver function test reviewed interpreted within normal limits CBC reviewed interpreted within normal limits Pregnancy test negative [DR]  0925 CT  head reviewed interpreted notes of acute abnormality noted on my interpretation and radiology interpretation notes asymmetry of the lateral ventricular size with the right being larger than the left but recommend MRI in nonemergent outpatient setting [DR]    Clinical Course User Index [DR] Levander Houston, MD                                 Medical Decision Making Amount and/or Complexity of Data Reviewed Labs: ordered. Radiology: ordered.  Risk Prescription drug management.  33 year old female presents today with report of a seizure.  During the exam, a white-gray rocklike substance was recovered from the patient's sock.  She reports that this is crack and that she has not smoked today.  She then became very agitated and stated that she did not wish to speak to me anymore and to ask her girlfriend. This occurred in the middle of the history and physical exam, hence initial history and physical exam are not complete 12:23 PM Reexamined patient.  She is awake and alert and pleasant.  She states that her girlfriend is outside.   Plan to attempt to get further history from her girlfriend.   33 year old female with new onset of seizures by history.  She has been having multiple seizures over the past several months.  Today she appeared initially postictal but is back to baseline Differential diagnosis includes but is not limited to primary epilepsy, substance abuse, electrolyte abnormality, intracranial abnormality including masses or bleeding. Patient was evaluated here with CT that does not show any definite mass or obvious etiology but she does have asymmetrical enlargement of her ventricles and they advised outpatient MRI. Patient admits to cocaine use and urine drug screen is positive for cocaine and THC.  Cocaine could be contributing to seizures. Otherwise complete metabolic panel and CBC are normal and noncontributory. Based on history we will begin Keppra . Plan outpatient Keppra , referral to neurology for follow-up and outpatient imaging as indicated.      Final diagnoses:  New onset seizure [facility])    ED Discharge Orders          Ordered    levETIRAcetam  (KEPPRA ) 500 MG tablet  2 times daily        10/24/23 1109               Levander Houston, MD 10/24/23 1223

## 2023-10-24 NOTE — ED Triage Notes (Signed)
 Pt recently diagnosed with seizure disorder. Had seizure 20 minutes ago and fell. Pt has abrasions on face. Pt is not on medication for seizure. Pt fell off bed. Did not bite tongue. Seizure was full body shaking and stiffness and lasted about 8 minutes

## 2023-10-24 NOTE — ED Notes (Signed)
 CBG sample pulled from IV and was potentially diluted. Tech to recollect

## 2023-10-24 NOTE — Discharge Instructions (Addendum)
 [place] statutes, patients with seizures are not allowed to drive until they have been seizure-free for six months. Use caution when using heavy equipment or power tools. Avoid working on ladders or at heights. Take showers instead of baths. Ensure the water temperature is not too high on the home water heater. Do not go swimming alone. Do not lock yourself in a room alone (i.e. bathroom). When caring for infants or small children, sit down when holding, feeding, or changing them to minimize risk of injury to the child in the event you have a seizure. Maintain good sleep hygiene. Avoid alcohol.    If Pamela Bridges has another seizure, call 911 and bring them back to the ED if:       A.  The seizure lasts longer than 5 minutes.            B.  The patient doesn't wake shortly after the seizure or has new problems such as difficulty seeing, speaking or moving following the seizure       C.  The patient was injured during the seizure       D.  The patient has a temperature over 102 F (39C)       E.  The patient vomited during the seizure and now is having trouble breathing  Please avoid anything that would contribute to having seizures such as any illicit substances. Take medication as prescribed Call neurology for follow-up and outpatient MRI.  Return if you are having new or worsening symptoms

## 2023-10-24 NOTE — ED Notes (Signed)
 Patient transported to CT

## 2023-10-27 ENCOUNTER — Emergency Department (HOSPITAL_COMMUNITY): Payer: Self-pay

## 2023-10-27 ENCOUNTER — Encounter (HOSPITAL_COMMUNITY): Payer: Self-pay | Admitting: Emergency Medicine

## 2023-10-27 ENCOUNTER — Other Ambulatory Visit: Payer: Self-pay

## 2023-10-27 ENCOUNTER — Emergency Department (HOSPITAL_COMMUNITY)
Admission: EM | Admit: 2023-10-27 | Discharge: 2023-10-27 | Disposition: A | Discharge status: Home / Self Care | Payer: Self-pay | Attending: Emergency Medicine | Admitting: Emergency Medicine

## 2023-10-27 DIAGNOSIS — S61211A Laceration without foreign body of left index finger without damage to nail, initial encounter: Secondary | ICD-10-CM | POA: Insufficient documentation

## 2023-10-27 DIAGNOSIS — S61213A Laceration without foreign body of left middle finger without damage to nail, initial encounter: Secondary | ICD-10-CM | POA: Insufficient documentation

## 2023-10-27 DIAGNOSIS — S61210A Laceration without foreign body of right index finger without damage to nail, initial encounter: Secondary | ICD-10-CM | POA: Insufficient documentation

## 2023-10-27 MED ORDER — OXYCODONE HCL 5 MG PO TABS
5.0000 mg | ORAL_TABLET | Freq: Once | ORAL | Status: AC
  Administered 2023-10-27: 5 mg via ORAL
  Filled 2023-10-27: qty 1

## 2023-10-27 MED ORDER — HYDROGEN PEROXIDE 3 % EX SOLN
CUTANEOUS | Status: AC
  Filled 2023-10-27: qty 473

## 2023-10-27 MED ORDER — LIDOCAINE HCL (PF) 1 % IJ SOLN
5.0000 mL | Freq: Once | INTRAMUSCULAR | Status: AC
  Administered 2023-10-27: 5 mL
  Filled 2023-10-27: qty 30

## 2023-10-27 NOTE — Discharge Instructions (Signed)
 Thank for let us  evaluate you today.  Your x-ray did not show any signs of foreign body or fracture.  Please do not submerge your hand in soapy water, hot tub, pool, bath water, dish soap.  You may shower normally let the water run over your hand.  You may keep your fingers wrapped tonight to keep bleeding controlled.  Make sure that you can always feel your finger sensation as you do not want to cut off blood supply
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
